# Patient Record
Sex: Female | Born: 1985
Health system: Southern US, Community
[De-identification: ages and names within clinical notes are randomized; demographics above are authoritative.]

## PROBLEM LIST (undated history)

## (undated) DIAGNOSIS — Z789 Other specified health status: Secondary | ICD-10-CM

## (undated) HISTORY — PX: NO PAST SURGERIES: SHX2092

---

## 2012-11-03 ENCOUNTER — Other Ambulatory Visit (HOSPITAL_COMMUNITY)
Admission: RE | Admit: 2012-11-03 | Discharge: 2012-11-03 | Disposition: A | Discharge status: Home / Self Care | Payer: Managed Care, Other (non HMO) | Source: Ambulatory Visit | Attending: Obstetrics and Gynecology | Admitting: Obstetrics and Gynecology

## 2012-11-03 DIAGNOSIS — N760 Acute vaginitis: Secondary | ICD-10-CM | POA: Insufficient documentation

## 2012-11-03 DIAGNOSIS — Z01419 Encounter for gynecological examination (general) (routine) without abnormal findings: Secondary | ICD-10-CM | POA: Insufficient documentation

## 2013-11-17 ENCOUNTER — Emergency Department (HOSPITAL_BASED_OUTPATIENT_CLINIC_OR_DEPARTMENT_OTHER)
Admission: EM | Admit: 2013-11-17 | Discharge: 2013-11-17 | Disposition: A | Discharge status: Home / Self Care | Payer: Managed Care, Other (non HMO) | Attending: Emergency Medicine | Admitting: Emergency Medicine

## 2013-11-17 ENCOUNTER — Encounter (HOSPITAL_BASED_OUTPATIENT_CLINIC_OR_DEPARTMENT_OTHER): Payer: Self-pay | Admitting: Emergency Medicine

## 2013-11-17 ENCOUNTER — Emergency Department (HOSPITAL_BASED_OUTPATIENT_CLINIC_OR_DEPARTMENT_OTHER): Payer: Managed Care, Other (non HMO)

## 2013-11-17 DIAGNOSIS — Z3202 Encounter for pregnancy test, result negative: Secondary | ICD-10-CM | POA: Insufficient documentation

## 2013-11-17 DIAGNOSIS — R42 Dizziness and giddiness: Secondary | ICD-10-CM | POA: Insufficient documentation

## 2013-11-17 DIAGNOSIS — N920 Excessive and frequent menstruation with regular cycle: Secondary | ICD-10-CM | POA: Insufficient documentation

## 2013-11-17 DIAGNOSIS — N921 Excessive and frequent menstruation with irregular cycle: Secondary | ICD-10-CM

## 2013-11-17 LAB — CBC WITH DIFFERENTIAL/PLATELET
Basophils Absolute: 0 10*3/uL (ref 0.0–0.1)
Basophils Relative: 0 % (ref 0–1)
Eosinophils Absolute: 0.1 10*3/uL (ref 0.0–0.7)
Eosinophils Relative: 1 % (ref 0–5)
HCT: 33.3 % — ABNORMAL LOW (ref 36.0–46.0)
HEMOGLOBIN: 11.3 g/dL — AB (ref 12.0–15.0)
Lymphocytes Relative: 38 % (ref 12–46)
Lymphs Abs: 4 10*3/uL (ref 0.7–4.0)
MCH: 27.3 pg (ref 26.0–34.0)
MCHC: 33.9 g/dL (ref 30.0–36.0)
MCV: 80.4 fL (ref 78.0–100.0)
MONOS PCT: 7 % (ref 3–12)
Monocytes Absolute: 0.8 10*3/uL (ref 0.1–1.0)
Neutro Abs: 5.6 10*3/uL (ref 1.7–7.7)
Neutrophils Relative %: 53 % (ref 43–77)
Platelets: 376 10*3/uL (ref 150–400)
RBC: 4.14 MIL/uL (ref 3.87–5.11)
RDW: 13.1 % (ref 11.5–15.5)
WBC: 10.5 10*3/uL (ref 4.0–10.5)

## 2013-11-17 LAB — PREGNANCY, URINE: Preg Test, Ur: NEGATIVE

## 2013-11-17 MED ORDER — SODIUM CHLORIDE 0.9 % IV SOLN
Freq: Once | INTRAVENOUS | Status: AC
  Administered 2013-11-17: 18:00:00 via INTRAVENOUS

## 2013-11-17 MED ORDER — IBUPROFEN 200 MG PO TABS
ORAL_TABLET | ORAL | Status: AC
  Administered 2013-11-17: 600 mg via ORAL
  Filled 2013-11-17: qty 3

## 2013-11-17 MED ORDER — IBUPROFEN 400 MG PO TABS
400.0000 mg | ORAL_TABLET | Freq: Once | ORAL | Status: AC
  Administered 2013-11-17: 600 mg via ORAL

## 2013-11-17 MED ORDER — IBUPROFEN 600 MG PO TABS: 600.0000 mg | ORAL_TABLET | Freq: Four times a day (QID) | ORAL | Status: DC | PRN

## 2013-11-17 MED ORDER — IBUPROFEN 100 MG/5ML PO SUSP
600.0000 mg | Freq: Once | ORAL | Status: DC
  Filled 2013-11-17: qty 30

## 2013-11-17 NOTE — ED Provider Notes (Signed)
Medical screening examination/treatment/procedure(s) were performed by non-physician practitioner and as supervising physician I was immediately available for consultation/collaboration.   EKG Interpretation None       Jaivion Kingsley, MD 11/17/13 1910 

## 2013-11-17 NOTE — ED Provider Notes (Signed)
CSN: [id number]     Arrival date & time 11/17/13  1603 History   First MD Initiated Contact with Patient 11/17/13 1610     Chief Complaint  Patient presents with  . Menorrhagia     (Consider location/radiation/quality/duration/timing/severity/associated sxs/prior Treatment) Patient is a 28 y.o. female presenting with vaginal bleeding. The history is provided by the patient.  Vaginal Bleeding Quality:  Bright red Severity:  Severe Onset quality:  Sudden Timing:  Constant Progression:  Worsening Chronicity:  New Menstrual history:  Irregular Number of pads used:  1 pad per hour since 11 am today Possible pregnancy: no   Relieved by:  None tried Worsened by:  Nothing tried Ineffective treatments:  None tried Associated symptoms: no abdominal pain, no back pain, no dysuria, no fever, no nausea and no vaginal discharge   Risk factors: unprotected sex   Risk factors: no bleeding disorder, no new sexual partner, no ovarian cysts and no terminated pregnancies    Dorina Ribaudo is a 28 y.o. female who presents to the ED with vaginal bleeding that started 2 days ago. She states that she had not had a period in 4 months, then on 2/27 had unprotected sex and took the morning after pill. Started bleeding 3/5 that has continued to be heavy.   History reviewed. No pertinent past medical history. History reviewed. No pertinent past surgical history. No family history on file. History  Substance Use Topics  . Smoking status: Never Smoker   . Smokeless tobacco: Not on file  . Alcohol Use: No   OB History   Grav Para Term Preterm Abortions TAB SAB Ect Mult Living   0              Review of Systems  Constitutional: Negative for fever and chills.  HENT: Negative.   Eyes: Negative for visual disturbance.  Respiratory: Negative for cough and shortness of breath.   Gastrointestinal: Negative for nausea and abdominal pain.  Genitourinary: Positive for vaginal bleeding. Negative for dysuria,  urgency, frequency and vaginal discharge.  Musculoskeletal: Negative for back pain.  Skin: Negative for rash.  Neurological: Positive for light-headedness.  Psychiatric/Behavioral: Negative for confusion. The patient is not nervous/anxious.       Allergies  Review of patient's allergies indicates no known allergies.  Home Medications  No current outpatient prescriptions on file. BP 120/79  Pulse 94  Temp(Src) 98 F (36.7 C) (Oral)  Resp 20  Ht 5\' 3"  (1.6 m)  Wt 126 lb (57.153 kg)  BMI 22.33 kg/m2  SpO2 100%  LMP 11/15/2013 Physical Exam  Nursing note and vitals reviewed. Constitutional: She is oriented to person, place, and time. She appears well-developed and well-nourished. No distress.  HENT:  Head: Normocephalic.  Eyes: EOM are normal.  Neck: Neck supple.  Cardiovascular: Normal rate and regular rhythm.   Pulmonary/Chest: Effort normal. She has no wheezes. She has no rales.  Abdominal: Soft. Bowel sounds are normal. There is no tenderness.  Genitourinary:  External genitalia without lesions, large clots vaginal vault. BB size cystic area noted in cervix. Bleeding from the cervix. Uterus without palpable enlargement, no adnexal tenderness or mass palpated.   Musculoskeletal: Normal range of motion.  Neurological: She is alert and oriented to person, place, and time. No cranial nerve deficit.  Skin: Skin is warm and dry.  Psychiatric: She has a normal mood and affect. Her behavior is normal.   Results for orders placed during the hospital encounter of 11/17/13 (from the past 24 hour(s))  PREGNANCY, URINE     Status: None   Collection Time    11/17/13  4:20 PM      Result Value Ref Range   Preg Test, Ur NEGATIVE  NEGATIVE  CBC WITH DIFFERENTIAL     Status: Abnormal   Collection Time    11/17/13  4:40 PM      Result Value Ref Range   WBC 10.5  4.0 - 10.5 K/uL   RBC 4.14  3.87 - 5.11 MIL/uL   Hemoglobin 11.3 (*) 12.0 - 15.0 g/dL   HCT 16.1 (*) 09.6 - 04.5 %   MCV  80.4  78.0 - 100.0 fL   MCH 27.3  26.0 - 34.0 pg   MCHC 33.9  30.0 - 36.0 g/dL   RDW 40.9  81.1 - 91.4 %   Platelets 376  150 - 400 K/uL   Neutrophils Relative % 53  43 - 77 %   Neutro Abs 5.6  1.7 - 7.7 K/uL   Lymphocytes Relative 38  12 - 46 %   Lymphs Abs 4.0  0.7 - 4.0 K/uL   Monocytes Relative 7  3 - 12 %   Monocytes Absolute 0.8  0.1 - 1.0 K/uL   Eosinophils Relative 1  0 - 5 %   Eosinophils Absolute 0.1  0.0 - 0.7 K/uL   Basophils Relative 0  0 - 1 %   Basophils Absolute 0.0  0.0 - 0.1 K/uL   US Transvaginal Non-ob  11/17/2013   CLINICAL DATA:  Heavy vaginal bleeding since earlier today  EXAM: TRANSABDOMINAL AND TRANSVAGINAL ULTRASOUND OF PELVIS  TECHNIQUE: Both transabdominal and transvaginal ultrasound examinations of the pelvis were performed. Transabdominal technique was performed for global imaging of the pelvis including uterus, ovaries, adnexal regions, and pelvic cul-de-sac. It was necessary to proceed with endovaginal exam following the transabdominal exam to visualize the uterus and ovaries and adnexal structures.  COMPARISON:  None  FINDINGS: Uterus  Measurements: 7.7 x 3.4 x 4.4 cm. No fibroids or other mass visualized. The uterus is retroverted  Endometrium  Thickness: 9.4 mm. The echotexture of the endometrium is heterogeneous. No endometrial polyp is demonstrated.  .  Right ovary  Measurements: 3.3 x 2.0 x 2.4 cm. Normal appearance/no adnexal mass.  Left ovary  Measurements: 2.7 x 2.3 x 2.6 cm. Normal appearance/no adnexal mass.  Other findings  No free fluid.  IMPRESSION: 1. The endometrial stripe appears mildly thickened and is heterogeneous in echotexture. No polyp is demonstrated. 2. The uterus is retroverted.  No fibroids are demonstrated. 3. The ovaries appear normal. 4. There is no free fluid within the pelvis.   Electronically Signed   By: David  [place]   On: 11/17/2013 18:44   US Pelvis Complete  11/17/2013   CLINICAL DATA:  Heavy vaginal bleeding since earlier today   EXAM: TRANSABDOMINAL AND TRANSVAGINAL ULTRASOUND OF PELVIS  TECHNIQUE: Both transabdominal and transvaginal ultrasound examinations of the pelvis were performed. Transabdominal technique was performed for global imaging of the pelvis including uterus, ovaries, adnexal regions, and pelvic cul-de-sac. It was necessary to proceed with endovaginal exam following the transabdominal exam to visualize the uterus and ovaries and adnexal structures.  COMPARISON:  None  FINDINGS: Uterus  Measurements: 7.7 x 3.4 x 4.4 cm. No fibroids or other mass visualized. The uterus is retroverted  Endometrium  Thickness: 9.4 mm. The echotexture of the endometrium is heterogeneous. No endometrial polyp is demonstrated.  .  Right ovary  Measurements: 3.3 x 2.0 x 2.4  cm. Normal appearance/no adnexal mass.  Left ovary  Measurements: 2.7 x 2.3 x 2.6 cm. Normal appearance/no adnexal mass.  Other findings  No free fluid.  IMPRESSION: 1. The endometrial stripe appears mildly thickened and is heterogeneous in echotexture. No polyp is demonstrated. 2. The uterus is retroverted.  No fibroids are demonstrated. 3. The ovaries appear normal. 4. There is no free fluid within the pelvis.   Electronically Signed   By: David  [place]   On: 11/17/2013 18:44     ED Course:   Procedures After Normal saline 1000 ccs and PO ibuprofen patient feeling much better and bleeding is light.  MDM  28 y.o. female with heavy vaginal bleeding after no period x 4 months. Hx of irregular periods. She is stable for discharge without any signs of hemorrhage. Vital signs stable, she is not orthostatic, Hgb is stable and pregnancy test is negative. She will follow up with her GYN Dr. Dion BodyVarnado. If her symptoms worsen [facility].  Discussed with the patient and all questioned fully answered.   Medication List         ibuprofen 600 MG tablet  Commonly known as:  ADVIL,MOTRIN  Take 1 tablet (600 mg total) by mouth every 6  (six) hours as needed.         [facility] Orlene OchM Neese, [place]NP 11/17/13 1905

## 2013-11-17 NOTE — Discharge Instructions (Signed)

## 2013-11-17 NOTE — ED Notes (Signed)
Patient transported to Ultrasound 

## 2013-11-17 NOTE — ED Notes (Signed)
Reports absence of her period x four months.  Took a pregnancy test which resulted negative 11/05/13.  Had unprotected sex 11/08/13.  Took the Emergency Contraceptive Pill 11/09/13.  Onset of heavy bleeding on 11/15/13.

## 2014-10-13 IMAGING — US US PELVIS COMPLETE
1 series · 13 of 25 positions shown · non-contrast
Comparison: None

CLINICAL DATA: Heavy vaginal bleeding since earlier today

EXAM:
TRANSABDOMINAL AND TRANSVAGINAL ULTRASOUND OF PELVIS
TECHNIQUE: Both transabdominal and transvaginal ultrasound examinations of the
pelvis were performed. Transabdominal technique was performed for
global imaging of the pelvis including uterus, ovaries, adnexal
regions, and pelvic cul-de-sac. It was necessary to proceed with
endovaginal exam following the transabdominal exam to visualize the
uterus and ovaries and adnexal structures.

[Series 1: us pelvis complete · 0.33mm/px · 13 of 101 slices shown]
[im 1/101]
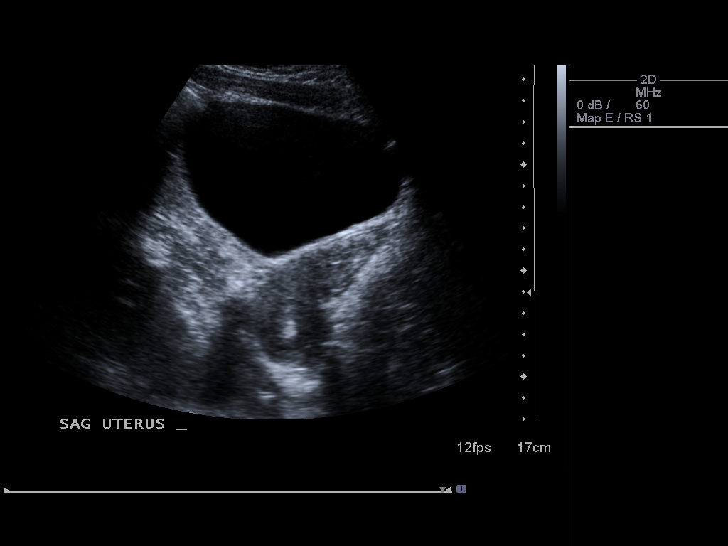
[im 9/101]
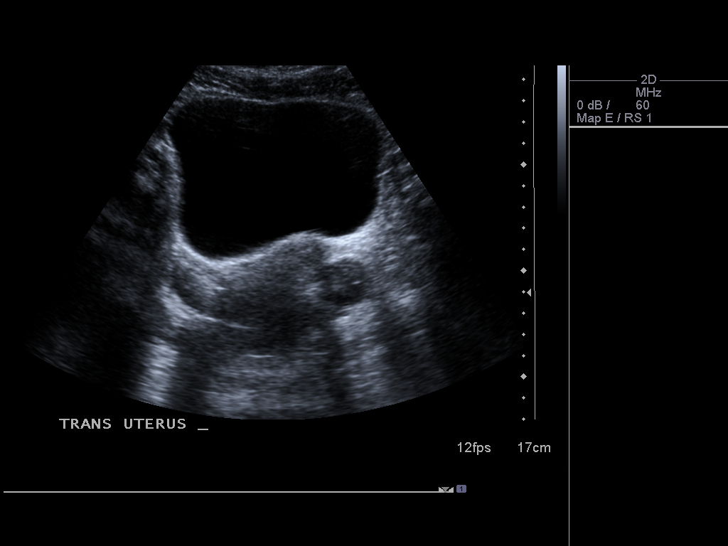
[im 17/101]
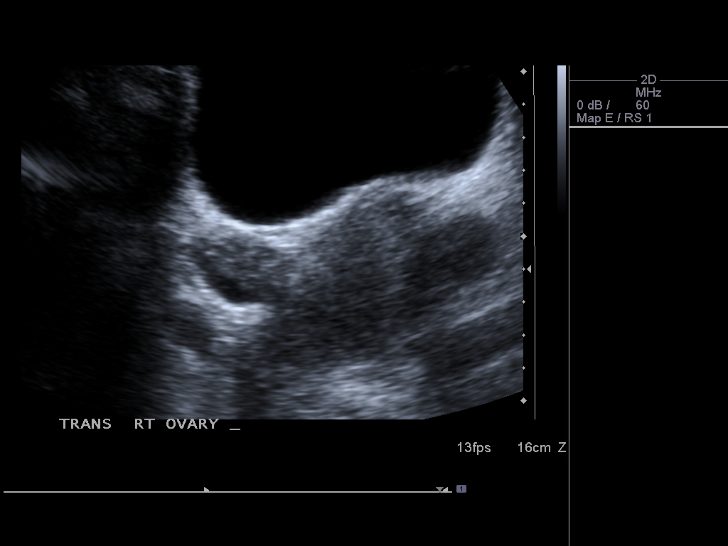
[im 26/101]
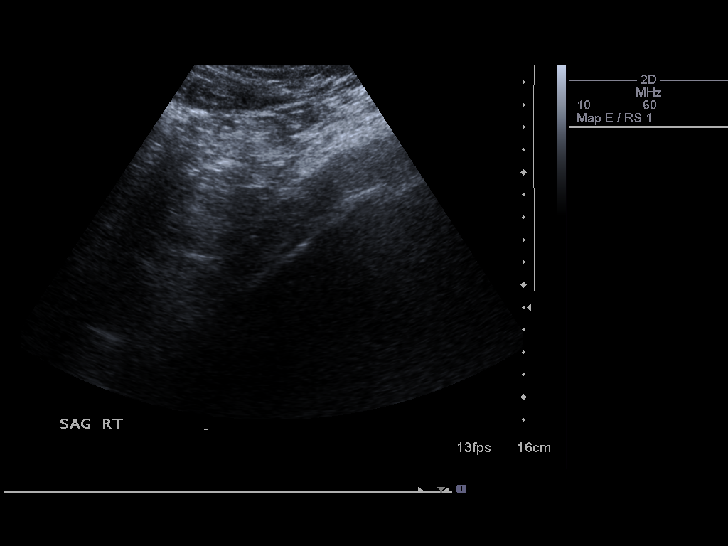
[im 34/101]
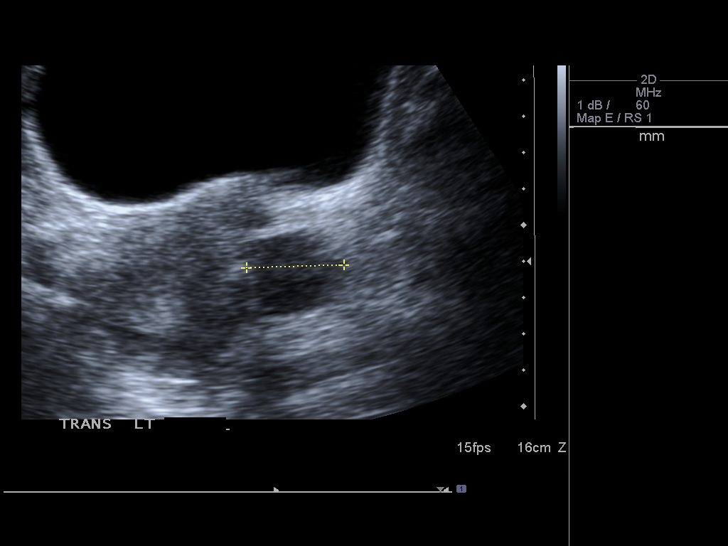
[im 42/101]
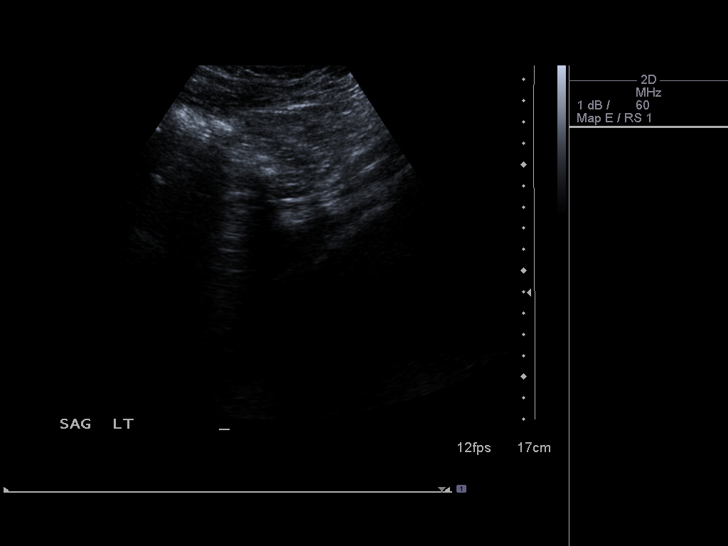
[im 51/101]
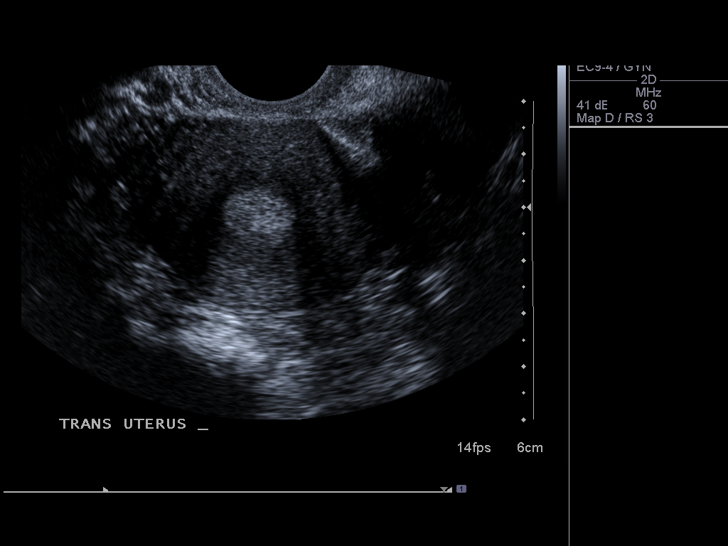
[im 59/101]
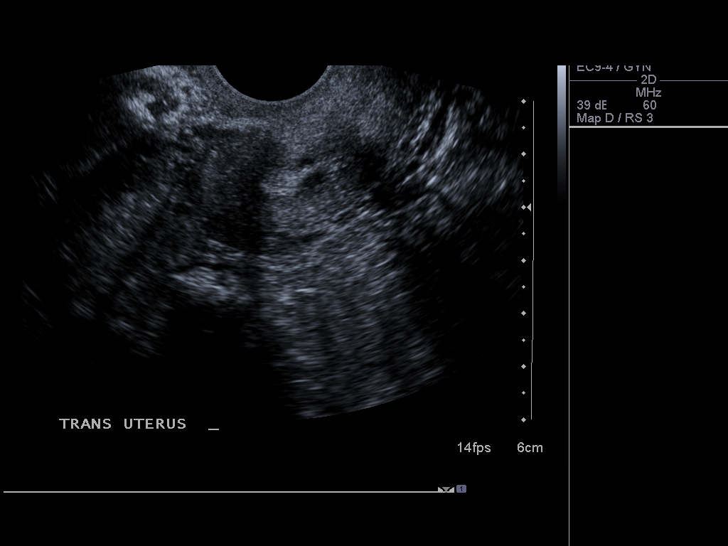
[im 67/101]
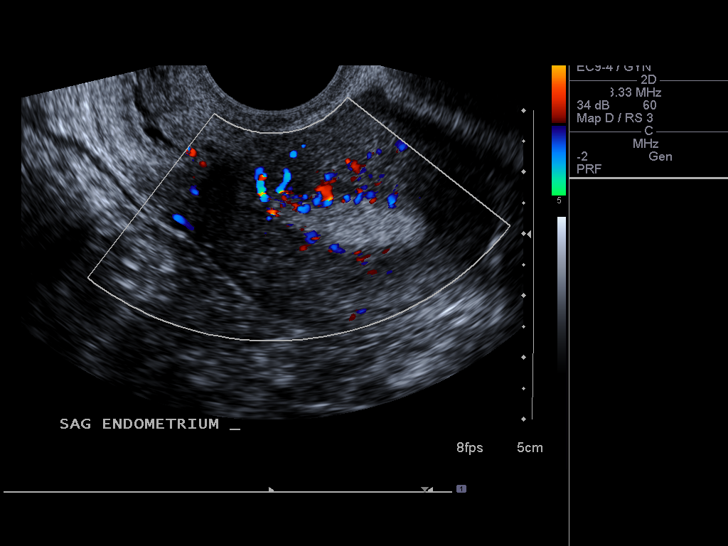
[im 76/101]
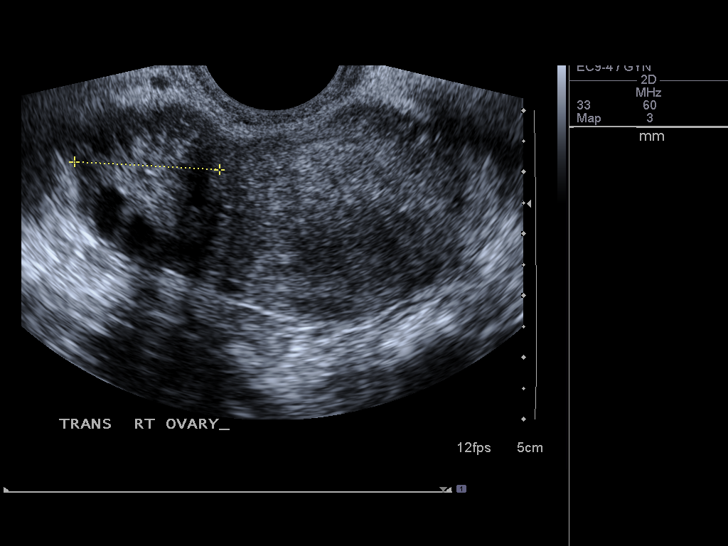
[im 84/101]
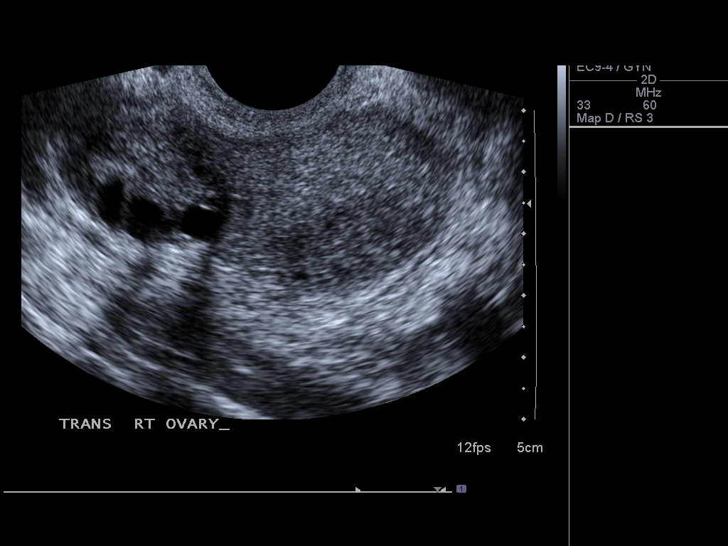
[im 92/101]
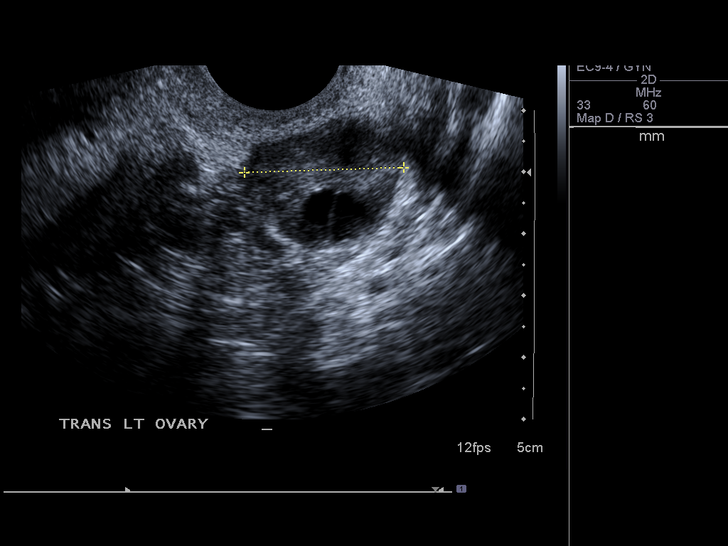
[im 101/101]
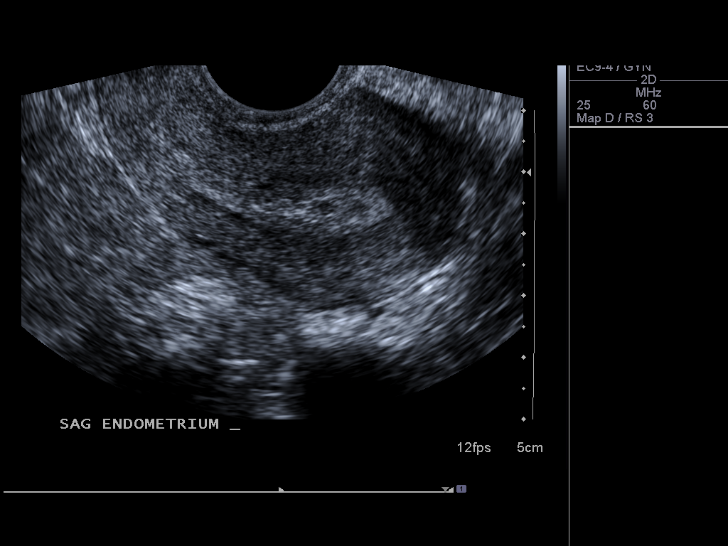

[13 of 25 positions shown; findings below may reference images not displayed]

FINDINGS: Uterus

Measurements: 7.7 x 3.4 x 4.4 cm.. No fibroids or other mass
visualized. The uterus is retroverted

Endometrium

Thickness: 9.4 mm. The echotexture of the endometrium is
heterogeneous. No endometrial polyp is demonstrated.

.

Right ovary

Measurements: 3.3 x 2.0 x 2.4 cm. Normal appearance/no adnexal mass.

Left ovary

Measurements: 2.7 x 2.3 x 2.6 cm. Normal appearance/no adnexal mass.

Other findings

No free fluid.
IMPRESSION: 1. The endometrial stripe appears mildly thickened and is
heterogeneous in echotexture. No polyp is demonstrated.
2. The uterus is retroverted.  No fibroids are demonstrated.
3. The ovaries appear normal.
4. There is no free fluid within the pelvis.

## 2017-08-08 LAB — OB RESULTS CONSOLE RPR: RPR: NONREACTIVE

## 2017-08-08 LAB — OB RESULTS CONSOLE GC/CHLAMYDIA
Chlamydia: NEGATIVE
Gonorrhea: NEGATIVE

## 2017-08-08 LAB — OB RESULTS CONSOLE HIV ANTIBODY (ROUTINE TESTING): HIV: NONREACTIVE

## 2017-08-08 LAB — OB RESULTS CONSOLE HEPATITIS B SURFACE ANTIGEN: HEP B S AG: NEGATIVE

## 2017-08-08 LAB — OB RESULTS CONSOLE RUBELLA ANTIBODY, IGM: RUBELLA: IMMUNE

## 2017-09-07 ENCOUNTER — Other Ambulatory Visit (HOSPITAL_COMMUNITY)
Admission: RE | Admit: 2017-09-07 | Discharge: 2017-09-07 | Disposition: A | Discharge status: Home / Self Care | Payer: 59 | Source: Ambulatory Visit | Attending: Obstetrics and Gynecology | Admitting: Obstetrics and Gynecology

## 2017-09-07 DIAGNOSIS — Z124 Encounter for screening for malignant neoplasm of cervix: Secondary | ICD-10-CM | POA: Diagnosis not present

## 2017-09-08 ENCOUNTER — Other Ambulatory Visit: Payer: Self-pay | Admitting: Obstetrics and Gynecology

## 2017-09-14 LAB — CYTOLOGY - PAP
CHLAMYDIA, DNA PROBE: NEGATIVE
Diagnosis: NEGATIVE
HPV (WINDOPATH): NOT DETECTED
Neisseria Gonorrhea: NEGATIVE

## 2018-03-20 ENCOUNTER — Inpatient Hospital Stay (HOSPITAL_COMMUNITY): Payer: 59 | Admitting: Anesthesiology

## 2018-03-20 ENCOUNTER — Inpatient Hospital Stay (HOSPITAL_COMMUNITY)
Admission: AD | Admit: 2018-03-20 | Discharge: 2018-03-23 | DRG: 788 | Disposition: A | Discharge status: Home / Self Care | Payer: 59 | Attending: Obstetrics and Gynecology | Admitting: Obstetrics and Gynecology

## 2018-03-20 ENCOUNTER — Encounter (HOSPITAL_COMMUNITY): Payer: Self-pay | Admitting: *Deleted

## 2018-03-20 ENCOUNTER — Encounter (HOSPITAL_COMMUNITY)
Admission: AD | Disposition: A | Discharge status: Home / Self Care | Payer: Self-pay | Source: Home / Self Care | Attending: Obstetrics and Gynecology

## 2018-03-20 DIAGNOSIS — O322XX0 Maternal care for transverse and oblique lie, not applicable or unspecified: Principal | ICD-10-CM | POA: Diagnosis present

## 2018-03-20 DIAGNOSIS — O99824 Streptococcus B carrier state complicating childbirth: Secondary | ICD-10-CM | POA: Diagnosis present

## 2018-03-20 DIAGNOSIS — Z3A40 40 weeks gestation of pregnancy: Secondary | ICD-10-CM

## 2018-03-20 DIAGNOSIS — D649 Anemia, unspecified: Secondary | ICD-10-CM | POA: Diagnosis present

## 2018-03-20 DIAGNOSIS — O9902 Anemia complicating childbirth: Secondary | ICD-10-CM | POA: Diagnosis present

## 2018-03-20 DIAGNOSIS — Z3483 Encounter for supervision of other normal pregnancy, third trimester: Secondary | ICD-10-CM | POA: Diagnosis present

## 2018-03-20 DIAGNOSIS — O99284 Endocrine, nutritional and metabolic diseases complicating childbirth: Secondary | ICD-10-CM | POA: Diagnosis present

## 2018-03-20 DIAGNOSIS — E039 Hypothyroidism, unspecified: Secondary | ICD-10-CM | POA: Diagnosis present

## 2018-03-20 HISTORY — DX: Other specified health status: Z78.9

## 2018-03-20 LAB — RPR: RPR Ser Ql: NONREACTIVE

## 2018-03-20 LAB — CBC
HCT: 35.2 % — ABNORMAL LOW (ref 36.0–46.0)
HEMOGLOBIN: 11.9 g/dL — AB (ref 12.0–15.0)
MCH: 27.8 pg (ref 26.0–34.0)
MCHC: 33.8 g/dL (ref 30.0–36.0)
MCV: 82.2 fL (ref 78.0–100.0)
Platelets: 268 10*3/uL (ref 150–400)
RBC: 4.28 MIL/uL (ref 3.87–5.11)
RDW: 14.5 % (ref 11.5–15.5)
WBC: 8.4 10*3/uL (ref 4.0–10.5)

## 2018-03-20 LAB — TYPE AND SCREEN
ABO/RH(D): AB POS
ANTIBODY SCREEN: NEGATIVE

## 2018-03-20 LAB — OB RESULTS CONSOLE GBS: STREP GROUP B AG: POSITIVE

## 2018-03-20 LAB — ABO/RH: ABO/RH(D): AB POS

## 2018-03-20 SURGERY — Surgical Case
Anesthesia: Epidural

## 2018-03-20 MED ORDER — SODIUM CHLORIDE 0.9 % IV SOLN
5.0000 10*6.[IU] | Freq: Once | INTRAVENOUS | Status: AC
  Administered 2018-03-20: 5 10*6.[IU] via INTRAVENOUS
  Filled 2018-03-20: qty 5

## 2018-03-20 MED ORDER — DIPHENHYDRAMINE HCL 50 MG/ML IJ SOLN: 12.5000 mg | INTRAMUSCULAR | Status: DC | PRN

## 2018-03-20 MED ORDER — FENTANYL 2.5 MCG/ML BUPIVACAINE 1/10 % EPIDURAL INFUSION (WH - ANES)
14.0000 mL/h | INTRAMUSCULAR | Status: DC | PRN
  Administered 2018-03-20 (×2): 14 mL/h via EPIDURAL
  Filled 2018-03-20 (×2): qty 100

## 2018-03-20 MED ORDER — LACTATED RINGERS IV SOLN
INTRAVENOUS | Status: DC
  Administered 2018-03-20 (×5): via INTRAVENOUS

## 2018-03-20 MED ORDER — OXYTOCIN 40 UNITS IN LACTATED RINGERS INFUSION - SIMPLE MED
1.0000 m[IU]/min | INTRAVENOUS | Status: DC
  Administered 2018-03-20: 2 m[IU]/min via INTRAVENOUS
  Filled 2018-03-20: qty 1000

## 2018-03-20 MED ORDER — PHENYLEPHRINE 40 MCG/ML (10ML) SYRINGE FOR IV PUSH (FOR BLOOD PRESSURE SUPPORT): 80.0000 ug | PREFILLED_SYRINGE | INTRAVENOUS | Status: DC | PRN

## 2018-03-20 MED ORDER — SCOPOLAMINE 1 MG/3DAYS TD PT72
MEDICATED_PATCH | TRANSDERMAL | Status: AC
  Filled 2018-03-20: qty 1

## 2018-03-20 MED ORDER — LACTATED RINGERS IV SOLN: 500.0000 mL | INTRAVENOUS | Status: DC | PRN

## 2018-03-20 MED ORDER — LIDOCAINE HCL (PF) 1 % IJ SOLN
30.0000 mL | INTRAMUSCULAR | Status: DC | PRN
  Filled 2018-03-20: qty 30

## 2018-03-20 MED ORDER — CEFAZOLIN SODIUM-DEXTROSE 2-3 GM-%(50ML) IV SOLR
INTRAVENOUS | Status: DC | PRN
  Administered 2018-03-20: 2 g via INTRAVENOUS

## 2018-03-20 MED ORDER — ONDANSETRON HCL 4 MG/2ML IJ SOLN
INTRAMUSCULAR | Status: DC | PRN
  Administered 2018-03-20: 4 mg via INTRAVENOUS

## 2018-03-20 MED ORDER — DEXAMETHASONE SODIUM PHOSPHATE 10 MG/ML IJ SOLN
INTRAMUSCULAR | Status: DC | PRN
  Administered 2018-03-20: 10 mg via INTRAVENOUS

## 2018-03-20 MED ORDER — SOD CITRATE-CITRIC ACID 500-334 MG/5ML PO SOLN
30.0000 mL | ORAL | Status: DC | PRN
  Administered 2018-03-20: 30 mL via ORAL
  Filled 2018-03-20: qty 15

## 2018-03-20 MED ORDER — PHENYLEPHRINE 40 MCG/ML (10ML) SYRINGE FOR IV PUSH (FOR BLOOD PRESSURE SUPPORT)
80.0000 ug | PREFILLED_SYRINGE | INTRAVENOUS | Status: DC | PRN
  Filled 2018-03-20 (×2): qty 10

## 2018-03-20 MED ORDER — OXYTOCIN 40 UNITS IN LACTATED RINGERS INFUSION - SIMPLE MED: 2.5000 [IU]/h | INTRAVENOUS | Status: DC

## 2018-03-20 MED ORDER — LACTATED RINGERS IV SOLN: 500.0000 mL | Freq: Once | INTRAVENOUS | Status: DC

## 2018-03-20 MED ORDER — MEPERIDINE HCL 25 MG/ML IJ SOLN
INTRAMUSCULAR | Status: DC | PRN
  Administered 2018-03-20 (×2): 12.5 mg via INTRAVENOUS

## 2018-03-20 MED ORDER — SODIUM CHLORIDE 0.9 % IR SOLN
Status: DC | PRN
  Administered 2018-03-20 (×2): 1

## 2018-03-20 MED ORDER — PENICILLIN G POT IN DEXTROSE 60000 UNIT/ML IV SOLN
3.0000 10*6.[IU] | INTRAVENOUS | Status: DC
  Administered 2018-03-20 (×4): 3 10*6.[IU] via INTRAVENOUS
  Filled 2018-03-20 (×5): qty 50

## 2018-03-20 MED ORDER — TERBUTALINE SULFATE 1 MG/ML IJ SOLN
0.2500 mg | Freq: Once | INTRAMUSCULAR | Status: DC | PRN
Start: 2018-03-20 — End: 2018-03-21

## 2018-03-20 MED ORDER — SCOPOLAMINE 1 MG/3DAYS TD PT72
MEDICATED_PATCH | TRANSDERMAL | Status: DC | PRN
  Administered 2018-03-20: 1 via TRANSDERMAL

## 2018-03-20 MED ORDER — OXYCODONE-ACETAMINOPHEN 5-325 MG PO TABS: 2.0000 | ORAL_TABLET | ORAL | Status: DC | PRN

## 2018-03-20 MED ORDER — FENTANYL CITRATE (PF) 100 MCG/2ML IJ SOLN: 50.0000 ug | INTRAMUSCULAR | Status: DC | PRN

## 2018-03-20 MED ORDER — LIDOCAINE HCL (PF) 1 % IJ SOLN
INTRAMUSCULAR | Status: DC | PRN
  Administered 2018-03-20 (×2): 5 mL via EPIDURAL

## 2018-03-20 MED ORDER — OXYCODONE-ACETAMINOPHEN 5-325 MG PO TABS: 1.0000 | ORAL_TABLET | ORAL | Status: DC | PRN

## 2018-03-20 MED ORDER — ONDANSETRON HCL 4 MG/2ML IJ SOLN: 4.0000 mg | Freq: Four times a day (QID) | INTRAMUSCULAR | Status: DC | PRN

## 2018-03-20 MED ORDER — ACETAMINOPHEN 325 MG PO TABS
650.0000 mg | ORAL_TABLET | ORAL | Status: DC | PRN
  Administered 2018-03-20: 650 mg via ORAL
  Filled 2018-03-20: qty 2

## 2018-03-20 MED ORDER — DEXAMETHASONE SODIUM PHOSPHATE 10 MG/ML IJ SOLN
INTRAMUSCULAR | Status: AC
  Filled 2018-03-20: qty 1

## 2018-03-20 MED ORDER — OXYTOCIN BOLUS FROM INFUSION: 500.0000 mL | Freq: Once | INTRAVENOUS | Status: DC

## 2018-03-20 MED ORDER — OXYTOCIN 10 UNIT/ML IJ SOLN
INTRAMUSCULAR | Status: DC | PRN
  Administered 2018-03-20: 40 [IU] via INTRAVENOUS

## 2018-03-20 MED ORDER — ONDANSETRON HCL 4 MG/2ML IJ SOLN
INTRAMUSCULAR | Status: AC
  Filled 2018-03-20: qty 2

## 2018-03-20 MED ORDER — EPHEDRINE 5 MG/ML INJ
10.0000 mg | INTRAVENOUS | Status: DC | PRN
Start: 2018-03-20 — End: 2018-03-21

## 2018-03-20 MED ORDER — LACTATED RINGERS IV SOLN
INTRAVENOUS | Status: DC | PRN
  Administered 2018-03-20: via INTRAVENOUS

## 2018-03-20 MED ORDER — MORPHINE SULFATE (PF) 0.5 MG/ML IJ SOLN
INTRAMUSCULAR | Status: AC
  Filled 2018-03-20: qty 10

## 2018-03-20 MED ORDER — EPHEDRINE 5 MG/ML INJ: 10.0000 mg | INTRAVENOUS | Status: DC | PRN

## 2018-03-20 MED ORDER — MEPERIDINE HCL 25 MG/ML IJ SOLN
INTRAMUSCULAR | Status: AC
  Filled 2018-03-20: qty 1

## 2018-03-20 MED ORDER — LIDOCAINE-EPINEPHRINE (PF) 2 %-1:200000 IJ SOLN
INTRAMUSCULAR | Status: DC | PRN
  Administered 2018-03-20: 10 mL via EPIDURAL

## 2018-03-20 MED ORDER — CEFAZOLIN SODIUM-DEXTROSE 2-4 GM/100ML-% IV SOLN
INTRAVENOUS | Status: AC
  Filled 2018-03-20: qty 100

## 2018-03-20 MED ORDER — MORPHINE SULFATE (PF) 0.5 MG/ML IJ SOLN
INTRAMUSCULAR | Status: DC | PRN
  Administered 2018-03-20: 4 mg via EPIDURAL

## 2018-03-20 MED ORDER — OXYTOCIN 10 UNIT/ML IJ SOLN
INTRAMUSCULAR | Status: AC
  Filled 2018-03-20: qty 4

## 2018-03-20 SURGICAL SUPPLY — 39 items
BARRIER ADHS 3X4 INTERCEED (GAUZE/BANDAGES/DRESSINGS) ×4 IMPLANT
BENZOIN TINCTURE PRP APPL 2/3 (GAUZE/BANDAGES/DRESSINGS) ×2 IMPLANT
CHLORAPREP W/TINT 26ML (MISCELLANEOUS) ×2 IMPLANT
CLAMP CORD UMBIL (MISCELLANEOUS) IMPLANT
CLOTH BEACON ORANGE TIMEOUT ST (SAFETY) ×2 IMPLANT
DERMABOND ADVANCED (GAUZE/BANDAGES/DRESSINGS)
DERMABOND ADVANCED .7 DNX12 (GAUZE/BANDAGES/DRESSINGS) IMPLANT
DRSG OPSITE POSTOP 4X10 (GAUZE/BANDAGES/DRESSINGS) ×2 IMPLANT
ELECT REM PT RETURN 9FT ADLT (ELECTROSURGICAL) ×2
ELECTRODE REM PT RTRN 9FT ADLT (ELECTROSURGICAL) ×1 IMPLANT
EXTRACTOR VACUUM BELL STYLE (SUCTIONS) ×2 IMPLANT
GLOVE BIO SURGEON STRL SZ7 (GLOVE) ×2 IMPLANT
GLOVE BIOGEL PI IND STRL 7.0 (GLOVE) ×2 IMPLANT
GLOVE BIOGEL PI INDICATOR 7.0 (GLOVE) ×2
GOWN STRL REUS W/TWL LRG LVL3 (GOWN DISPOSABLE) ×4 IMPLANT
KIT ABG SYR 3ML LUER SLIP (SYRINGE) IMPLANT
NEEDLE HYPO 25X5/8 SAFETYGLIDE (NEEDLE) ×2 IMPLANT
NS IRRIG 1000ML POUR BTL (IV SOLUTION) ×2 IMPLANT
PACK C SECTION WH (CUSTOM PROCEDURE TRAY) ×2 IMPLANT
PAD ABD 7.5X8 STRL (GAUZE/BANDAGES/DRESSINGS) ×2 IMPLANT
PAD OB MATERNITY 4.3X12.25 (PERSONAL CARE ITEMS) ×2 IMPLANT
PENCIL SMOKE EVAC W/HOLSTER (ELECTROSURGICAL) ×2 IMPLANT
RETRACTOR TRAXI PANNICULUS (MISCELLANEOUS) ×1 IMPLANT
RTRCTR C-SECT PINK 25CM LRG (MISCELLANEOUS) ×2 IMPLANT
SPONGE GAUZE 4X4 12PLY STER LF (GAUZE/BANDAGES/DRESSINGS) ×4 IMPLANT
SPONGE LAP 18X18 RF (DISPOSABLE) ×4 IMPLANT
STRIP CLOSURE SKIN 1/2X4 (GAUZE/BANDAGES/DRESSINGS) ×2 IMPLANT
SUT CHROMIC 0 CTX 36 (SUTURE) IMPLANT
SUT MON AB 4-0 PS1 27 (SUTURE) ×4 IMPLANT
SUT PLAIN 0 NONE (SUTURE) IMPLANT
SUT PLAIN 2 0 XLH (SUTURE) ×4 IMPLANT
SUT VIC AB 0 CTX 36 (SUTURE) ×5
SUT VIC AB 0 CTX36XBRD ANBCTRL (SUTURE) ×5 IMPLANT
SUT VIC AB 2-0 CT1 27 (SUTURE) ×1
SUT VIC AB 2-0 CT1 TAPERPNT 27 (SUTURE) ×1 IMPLANT
TOWEL OR 17X24 6PK STRL BLUE (TOWEL DISPOSABLE) ×2 IMPLANT
TRAXI PANNICULUS RETRACTOR (MISCELLANEOUS) ×1
TRAY FOLEY W/BAG SLVR 14FR LF (SET/KITS/TRAYS/PACK) IMPLANT
WATER STERILE IRR 1000ML POUR (IV SOLUTION) ×2 IMPLANT

## 2018-03-20 NOTE — Progress Notes (Signed)
Pt comfortable with epidural.  BP 111/70   Pulse 75   Temp 98 F (36.7 C) (Oral)   Resp 18   Ht 5\' 3"  (1.6 m)   Wt 68.5 kg (151 lb)   LMP 06/03/2017   SpO2 100%   BMI 26.75 kg/m   Gen: NAD Cervix 4-5/80/0, posterior, soft.  Cat I Contractions 2-5 minutes  A/P IUP @ 40 3/7 weeks Spontaneous labor Contractions spaced.  Start Pitocin. Anticipate SVD.

## 2018-03-20 NOTE — Anesthesia Procedure Notes (Signed)
Epidural Patient location during procedure: OB Start time: 03/20/2018 5:26 AM End time: 03/20/2018 5:36 AM  Staffing Anesthesiologist: Achille RichHodierne, Sennie Borden, MD Performed: anesthesiologist   Preanesthetic Checklist Completed: patient identified, site marked, pre-op evaluation, timeout performed, IV checked, risks and benefits discussed and monitors and equipment checked  Epidural Patient position: sitting Prep: DuraPrep Patient monitoring: heart rate, cardiac monitor, continuous pulse ox and blood pressure Approach: midline Location: L2-L3 Injection technique: LOR saline  Needle:  Needle type: Tuohy  Needle gauge: 17 G Needle length: 9 cm Needle insertion depth: 4 cm Catheter type: closed end flexible Catheter size: 19 Gauge Catheter at skin depth: 10 cm Test dose: negative and Other  Assessment Events: blood not aspirated, injection not painful, no injection resistance and negative IV test  Additional Notes Informed consent obtained prior to proceeding including risk of failure, 1% risk of PDPH, risk of minor discomfort and bruising.  Discussed rare but serious complications including epidural abscess, permanent nerve injury, epidural hematoma.  Discussed alternatives to epidural analgesia and patient desires to proceed.  Timeout performed pre-procedure verifying patient name, procedure, and platelet count.  Patient tolerated procedure well. Reason for block:procedure for pain

## 2018-03-20 NOTE — Anesthesia Preprocedure Evaluation (Signed)
Anesthesia Evaluation  Patient identified by MRN, date of birth, ID band Patient awake    Reviewed: Allergy & Precautions, H&P , NPO status , Patient's Chart, lab work & pertinent test results  Airway Mallampati: II   Neck ROM: full    Dental   Pulmonary neg pulmonary ROS,    breath sounds clear to auscultation       Cardiovascular negative cardio ROS   Rhythm:regular Rate:Normal     Neuro/Psych    GI/Hepatic   Endo/Other    Renal/GU      Musculoskeletal   Abdominal   Peds  Hematology   Anesthesia Other Findings   Reproductive/Obstetrics (+) Pregnancy                             Anesthesia Physical Anesthesia Plan  ASA: I  Anesthesia Plan: Epidural   Post-op Pain Management:    Induction: Intravenous  PONV Risk Score and Plan: 2 and Treatment may vary due to age or medical condition  Airway Management Planned: Natural Airway  Additional Equipment:   Intra-op Plan:   Post-operative Plan:   Informed Consent: I have reviewed the patients History and Physical, chart, labs and discussed the procedure including the risks, benefits and alternatives for the proposed anesthesia with the patient or authorized representative who has indicated his/her understanding and acceptance.       Plan Discussed with: Anesthesiologist  Anesthesia Plan Comments:         Anesthesia Quick Evaluation  

## 2018-03-20 NOTE — MAU Note (Signed)
Urine in lab 

## 2018-03-20 NOTE — Progress Notes (Signed)
Jarvis NewcomerShivangi Allena Katzatel is a 32 y.o. G1P0 at 4746w2d  Subjective: Pt feeling pressure.  Vomiting x 2, +shakes.  Objective: BP 115/78   Pulse (!) 102   Temp 98.1 F (36.7 C) (Oral)   Resp 18   Ht 5\' 3"  (1.6 m)   Wt 68.5 kg (151 lb)   LMP 06/03/2017   SpO2 100%   BMI 26.75 kg/m  No intake/output data recorded. Total I/O In: -  Out: 650 [Urine:650]  FHT:  FHR: 130s bpm, variability: moderate,  accelerations:  Present,  decelerations:  Present previous variable late and early have resolved UC:   regular, every 2 minutes SVE:   Dilation: 9(Thick in front and on side) Effacement (%): 90 Station: 0 Exam by:: LIma, rn AROM clear fluid, minimal  Labs: Lab Results  Component Value Date   WBC 8.4 03/20/2018   HGB 11.9 (L) 03/20/2018   HCT 35.2 (L) 03/20/2018   MCV 82.2 03/20/2018   PLT 268 03/20/2018    Assessment / Plan: IUP @ 34 2/7 weeks  Labor: Progressing normally and Continue Pitocin at 4 mUs. Preeclampsia:  BP normal Fetal Wellbeing:  Category I and currently Pain Control:  Epidural I/D:  S/p PCN x 2 doses Anticipated MOD:  NSVD  Geryl Rankinsvelyn Sydell Prowell 03/20/2018, 2:01 PM

## 2018-03-20 NOTE — Progress Notes (Addendum)
Pt comfortable.  BP 108/73   Pulse (!) 102   Temp 98 F (36.7 C) (Oral)   Resp 18   Ht 5\' 3"  (1.6 m)   Wt 68.5 kg (151 lb)   LMP 06/03/2017   SpO2 100%   BMI 26.75 kg/m   Gen:  NAD Cervix:  Anterior lip b/w 9 and 1 o'clock, not reducible/ +1-+2 station, some caput noted.  EM:  Reassuring, Contractions q 1-3 min  Pitocin 1 mUs, MVU 200-250  Pt turned to right side on peanut.    Reassess in 45 minutes.  Start pushing when completely dilated.

## 2018-03-20 NOTE — Anesthesia Pain Management Evaluation Note (Signed)
  CRNA Pain Management Visit Note  Patient: Colleen BanterShivangi Siebers, 32 y.o., female  "Hello I am a member of the anesthesia team at [facility]. We have an anesthesia team available at all times to provide care throughout the hospital, including epidural management and anesthesia for C-section. I don't know your plan for the delivery whether it a natural birth, water birth, IV sedation, nitrous supplementation, doula or epidural, but we want to meet your pain goals."   1.Was your pain managed to your expectations on prior hospitalizations?   No prior hospitalizations  2.What is your expectation for pain management during this hospitalization?     Epidural  3.How can we help you reach that goal? Maintain epidural until delivery of infant.  Record the patient's initial score and the patient's pain goal.   Pain: 0  Pain Goal: 0 [facility] wants you to be able to say your pain was always managed very well.  Merryl Buckels 03/20/2018

## 2018-03-20 NOTE — H&P (Signed)
Colleen NewcomerShivangi Allena Sandoval is a 32 y.o. female, G1P0 at 40.2 weeks, presenting for contractions and spotting. Pt received her prenatal care at [facility] Pt states contractions started at 2300.  Having some bloody show.  FM+  Prenatal hx is remarkable for treatment of hyperthyroidism.  On Levothyroxine 75 mcg.  Pt had a polyp on her cervix.      History of present pregnancy: Patient entered care at 11.5 weeks.   EDC of 03/18/2018 was established by LMP.   Anatomy scan: 20 weeks, with normal findings.    Significant prenatal events:  None  Last evaluation:  Last week  OB History    Gravida  1   Para      Term      Preterm      AB      Living        SAB      TAB      Ectopic      Multiple      Live Births             History reviewed. No pertinent past medical history. History reviewed. No pertinent surgical history. Family History: family history is not on file. Social History:  reports that she has never smoked. She has never used smokeless tobacco. She reports that she does not drink alcohol or use drugs.   Prenatal Transfer Tool  Maternal Diabetes: No Genetic Screening: Normal Maternal Ultrasounds/Referrals: Normal Fetal Ultrasounds or other Referrals:  None Maternal Substance Abuse:  No Significant Maternal Medications:  None Significant Maternal Lab Results: None   ROS:  All 10 systems reviewed and negative except as stated above  No Known Allergies     Blood pressure 120/88, pulse 66, temperature 97.6 F (36.4 C), temperature source Oral, resp. rate 16, height 5\' 3"  (1.6 m), weight 68.5 kg (151 lb).  Chest clear Heart RRR without murmur Abd gravid, NT, FH appropriate Pelvic: 2/90 Vtx Ext: +2/+2 normal reflexes  FHR: Category 1  FHT 125 + variability accelss UCs:  Every 3 to 5  Prenatal labs: ABO, Rh:  AB positive Antibody:  Neg Rubella:   Imm RPR:   NR HBsAg:   Neg HIV:   NR GBS:  Positive GC:  Neg Chlamydia: Neg Genetic screenings:  wnl Glucola:  165  3 hour 81-166-105-120 Other:  TSH 3.58/ 2.32/ 3.72 Hgb 9.7 at NOB, 10.9at 28 weeks       Assessment/Plan: G1P0 at 40.3 IUP in early active labor Cat 1 strip  GBS positive Hypothyroidism  Plan: Pap: Admit to Birthing Suite Routine [facility] orders Pain med/epidural prn PCN G for GBS prophylaxis  Henderson NewcomerNancy Jean ProtheroCNM, MSN 03/20/2018, 2:08 AM

## 2018-03-21 ENCOUNTER — Encounter (HOSPITAL_COMMUNITY): Payer: Self-pay

## 2018-03-21 LAB — CBC
HEMATOCRIT: 32.3 % — AB (ref 36.0–46.0)
HEMOGLOBIN: 10.8 g/dL — AB (ref 12.0–15.0)
MCH: 27.8 pg (ref 26.0–34.0)
MCHC: 33.4 g/dL (ref 30.0–36.0)
MCV: 83 fL (ref 78.0–100.0)
Platelets: 237 10*3/uL (ref 150–400)
RBC: 3.89 MIL/uL (ref 3.87–5.11)
RDW: 14.6 % (ref 11.5–15.5)
WBC: 22 10*3/uL — AB (ref 4.0–10.5)

## 2018-03-21 LAB — HIV ANTIBODY (ROUTINE TESTING W REFLEX): HIV Screen 4th Generation wRfx: NONREACTIVE

## 2018-03-21 MED ORDER — OXYCODONE HCL 5 MG PO TABS: 5.0000 mg | ORAL_TABLET | Freq: Once | ORAL | Status: DC | PRN

## 2018-03-21 MED ORDER — NALOXONE HCL 4 MG/10ML IJ SOLN
1.0000 ug/kg/h | INTRAVENOUS | Status: DC | PRN
  Filled 2018-03-21: qty 5

## 2018-03-21 MED ORDER — SCOPOLAMINE 1 MG/3DAYS TD PT72
1.0000 | MEDICATED_PATCH | Freq: Once | TRANSDERMAL | Status: DC
  Filled 2018-03-21: qty 1

## 2018-03-21 MED ORDER — WITCH HAZEL-GLYCERIN EX PADS: 1.0000 "application " | MEDICATED_PAD | CUTANEOUS | Status: DC | PRN

## 2018-03-21 MED ORDER — OXYTOCIN 40 UNITS IN LACTATED RINGERS INFUSION - SIMPLE MED: 2.5000 [IU]/h | INTRAVENOUS | Status: AC

## 2018-03-21 MED ORDER — SODIUM CHLORIDE 0.9% FLUSH: 3.0000 mL | INTRAVENOUS | Status: DC | PRN

## 2018-03-21 MED ORDER — OXYCODONE HCL 5 MG PO TABS: 10.0000 mg | ORAL_TABLET | ORAL | Status: DC | PRN

## 2018-03-21 MED ORDER — NALBUPHINE HCL 10 MG/ML IJ SOLN: 5.0000 mg | Freq: Once | INTRAMUSCULAR | Status: DC | PRN

## 2018-03-21 MED ORDER — ZOLPIDEM TARTRATE 5 MG PO TABS: 5.0000 mg | ORAL_TABLET | Freq: Every evening | ORAL | Status: DC | PRN

## 2018-03-21 MED ORDER — PRENATAL MULTIVITAMIN CH
1.0000 | ORAL_TABLET | Freq: Every day | ORAL | Status: DC
  Administered 2018-03-21 – 2018-03-22 (×3): 1 via ORAL
  Filled 2018-03-21 (×3): qty 1

## 2018-03-21 MED ORDER — METHYLERGONOVINE MALEATE 0.2 MG PO TABS: 0.2000 mg | ORAL_TABLET | ORAL | Status: DC | PRN

## 2018-03-21 MED ORDER — SENNOSIDES-DOCUSATE SODIUM 8.6-50 MG PO TABS
2.0000 | ORAL_TABLET | ORAL | Status: DC
  Administered 2018-03-21: 2 via ORAL
  Filled 2018-03-21 (×2): qty 2

## 2018-03-21 MED ORDER — DIPHENHYDRAMINE HCL 25 MG PO CAPS: 25.0000 mg | ORAL_CAPSULE | Freq: Four times a day (QID) | ORAL | Status: DC | PRN

## 2018-03-21 MED ORDER — TETANUS-DIPHTH-ACELL PERTUSSIS 5-2.5-18.5 LF-MCG/0.5 IM SUSP: 0.5000 mL | Freq: Once | INTRAMUSCULAR | Status: DC

## 2018-03-21 MED ORDER — IBUPROFEN 600 MG PO TABS
600.0000 mg | ORAL_TABLET | Freq: Four times a day (QID) | ORAL | Status: DC
  Administered 2018-03-21 – 2018-03-23 (×7): 600 mg via ORAL
  Filled 2018-03-21 (×9): qty 1

## 2018-03-21 MED ORDER — ONDANSETRON HCL 4 MG/2ML IJ SOLN
4.0000 mg | Freq: Three times a day (TID) | INTRAMUSCULAR | Status: DC | PRN
  Filled 2018-03-21: qty 2

## 2018-03-21 MED ORDER — ACETAMINOPHEN 10 MG/ML IV SOLN: 1000.0000 mg | Freq: Once | INTRAVENOUS | Status: DC | PRN

## 2018-03-21 MED ORDER — DIPHENHYDRAMINE HCL 25 MG PO CAPS: 25.0000 mg | ORAL_CAPSULE | ORAL | Status: DC | PRN

## 2018-03-21 MED ORDER — SIMETHICONE 80 MG PO CHEW
80.0000 mg | CHEWABLE_TABLET | Freq: Three times a day (TID) | ORAL | Status: DC
  Administered 2018-03-21 – 2018-03-23 (×5): 80 mg via ORAL
  Filled 2018-03-21 (×5): qty 1

## 2018-03-21 MED ORDER — DIPHENHYDRAMINE HCL 50 MG/ML IJ SOLN: 12.5000 mg | INTRAMUSCULAR | Status: DC | PRN

## 2018-03-21 MED ORDER — OXYCODONE HCL 5 MG PO TABS: 5.0000 mg | ORAL_TABLET | ORAL | Status: DC | PRN

## 2018-03-21 MED ORDER — NALBUPHINE HCL 10 MG/ML IJ SOLN: 5.0000 mg | INTRAMUSCULAR | Status: DC | PRN

## 2018-03-21 MED ORDER — COCONUT OIL OIL: 1.0000 "application " | TOPICAL_OIL | Status: DC | PRN

## 2018-03-21 MED ORDER — SIMETHICONE 80 MG PO CHEW
80.0000 mg | CHEWABLE_TABLET | ORAL | Status: DC
  Administered 2018-03-22 (×2): 80 mg via ORAL
  Filled 2018-03-21 (×2): qty 1

## 2018-03-21 MED ORDER — PRENATAL MULTIVITAMIN CH
1.0000 | ORAL_TABLET | Freq: Every day | ORAL | Status: DC
  Filled 2018-03-21: qty 1

## 2018-03-21 MED ORDER — DIBUCAINE 1 % RE OINT: 1.0000 "application " | TOPICAL_OINTMENT | RECTAL | Status: DC | PRN

## 2018-03-21 MED ORDER — SIMETHICONE 80 MG PO CHEW: 80.0000 mg | CHEWABLE_TABLET | ORAL | Status: DC | PRN

## 2018-03-21 MED ORDER — ACETAMINOPHEN 325 MG PO TABS
650.0000 mg | ORAL_TABLET | ORAL | Status: DC | PRN
  Administered 2018-03-21 – 2018-03-23 (×3): 650 mg via ORAL
  Filled 2018-03-21 (×3): qty 2

## 2018-03-21 MED ORDER — OXYCODONE HCL 5 MG/5ML PO SOLN: 5.0000 mg | Freq: Once | ORAL | Status: DC | PRN

## 2018-03-21 MED ORDER — LACTATED RINGERS IV SOLN
INTRAVENOUS | Status: DC
  Administered 2018-03-21: 11:00:00 via INTRAVENOUS

## 2018-03-21 MED ORDER — LEVOTHYROXINE SODIUM 75 MCG PO TABS
75.0000 ug | ORAL_TABLET | Freq: Every day | ORAL | Status: DC
  Administered 2018-03-21 – 2018-03-23 (×3): 75 ug via ORAL
  Filled 2018-03-21 (×3): qty 1

## 2018-03-21 MED ORDER — HYDROMORPHONE HCL 1 MG/ML IJ SOLN: 0.2500 mg | INTRAMUSCULAR | Status: DC | PRN

## 2018-03-21 MED ORDER — NALOXONE HCL 0.4 MG/ML IJ SOLN: 0.4000 mg | INTRAMUSCULAR | Status: DC | PRN

## 2018-03-21 MED ORDER — METHYLERGONOVINE MALEATE 0.2 MG/ML IJ SOLN: 0.2000 mg | INTRAMUSCULAR | Status: DC | PRN

## 2018-03-21 MED ORDER — MENTHOL 3 MG MT LOZG: 1.0000 | LOZENGE | OROMUCOSAL | Status: DC | PRN

## 2018-03-21 MED ORDER — PROMETHAZINE HCL 25 MG/ML IJ SOLN: 6.2500 mg | INTRAMUSCULAR | Status: DC | PRN

## 2018-03-21 MED ORDER — FERROUS SULFATE 325 (65 FE) MG PO TABS
325.0000 mg | ORAL_TABLET | Freq: Two times a day (BID) | ORAL | Status: DC
  Administered 2018-03-21 – 2018-03-23 (×3): 325 mg via ORAL
  Filled 2018-03-21 (×3): qty 1

## 2018-03-21 NOTE — Brief Op Note (Signed)
03/20/2018 - 03/21/2018  12:36 AM  PATIENT:  Pennie BanterShivangi Jaffee  32 y.o. female  PRE-OPERATIVE DIAGNOSIS:  IUP @ 40 3/7 weeks,  nonreassuring fetal tracing, suspect malpresentation  POST-OPERATIVE DIAGNOSIS: Same, nuchal cord x 1, Transverse presentation  PROCEDURE:  Procedure(s): CESAREAN SECTION (N/A), Primary  SURGEON:  Surgeon(s) and Role:    Geryl Rankins* Amory Simonetti, MD - Primary  PHYSICIAN ASSISTANT:   ASSISTANTS: Illene BolusLori Clemmons, CNM, Technician   ANESTHESIA:   epidural  EBL:  993 mL   BLOOD ADMINISTERED:none  DRAINS: Urinary Catheter (Foley)   LOCAL MEDICATIONS USED:  NONE  SPECIMEN:  Source of Specimen:  Placenta  DISPOSITION OF SPECIMEN:  PATHOLOGY  COUNTS:  YES  TOURNIQUET:  * No tourniquets in log *  DICTATION: .Other Dictation: Dictation Number [id number]  PLAN OF CARE: Transfer to PACU then postpartum  PATIENT DISPOSITION:  PACU - hemodynamically stable.   Delay start of Pharmacological VTE agent (>24hrs) due to surgical blood loss or risk of bleeding: yes

## 2018-03-21 NOTE — Transfer of Care (Signed)
Immediate Anesthesia Transfer of Care Note  Patient: [name]  Procedure(s) Performed: CESAREAN SECTION (N/A )  Patient Location: PACU  Anesthesia Type:Epidural  Level of Consciousness: awake, alert  and oriented  Airway & Oxygen Therapy: Patient Spontanous Breathing  Post-op Assessment: Report given to RN and Post -op Vital signs reviewed and stable  Post vital signs: Reviewed and stable  Last Vitals:  Vitals Value Taken Time  BP 104/63 03/21/2018 12:38 AM  Temp    Pulse 74 03/21/2018 12:40 AM  Resp 19 03/21/2018 12:40 AM  SpO2 100 % 03/21/2018 12:40 AM  Vitals shown include unvalidated device data.  Last Pain:  Vitals:   03/20/18 2206  TempSrc: Axillary  PainSc:          Complications: No apparent anesthesia complications

## 2018-03-21 NOTE — Op Note (Signed)
[name], [name] MEDICAL RECORD ZO:[id number] ACCOUNT [id number] DATE OF BIRTH:1986/05/14 FACILITY: [facility] LOCATION: [id number] PHYSICIAN:Luka Reisch Derrell Lolling, MD  OPERATIVE REPORT  DATE OF PROCEDURE:  03/20/2018  PREOPERATIVE DIAGNOSES:  Intrauterine pregnancy at 40-3/7 weeks, nonreassuring fetal heart tracing, suspect malpresentation.  POSTOPERATIVE DIAGNOSES:  Intrauterine pregnancy at 40-3/7 weeks, nonreassuring fetal heart tracing, suspect malpresentation.  Nuchal cord x1.  Transverse presentation.  PROCEDURE:  Primary low transverse cesarean section with 2 layer closure.  SURGEON:  Geryl Rankins, MD  ASSISTANT:  Lucila Maine, certified nurse midwife and technician.  ANESTHESIA:  Epidural.  ESTIMATED BLOOD LOSS:  993  BLOOD ADMINISTERED:  None.  DRAINS:  Foley catheter.  LOCAL:  None.  SPECIMENS:  Placenta.  DISPOSITION OF SPECIMEN:  To pathology.  PATIENT DISPOSITION:  To PACU hemodynamically stable.  COMPLICATIONS:  None.  FINDINGS:  Viable female infant with nuchal cord x1 in the vertex position.  Transverse and then rotated to OP.  Clear fluid, terminal meconium.  Cord pH 7.09.  Normal uterus, fallopian tubes and ovaries bilaterally.  INDICATIONS:  The patient is a gravida 1 who presented at 40-2/7 weeks in spontaneous labor.  Her labor was augmented with Pitocin.  She had intermittent decelerations.  Pitocin was discontinued twice.  Ultimately, she had a protracted labor course  towards from 9 to complete.  She started having decelerations when she started to push that resolved when she was not pushing.  Prior to counseling the patient, her temperature was 100.1 and baby was tachycardic.  She received Tylenol.  She was GBS  positive and had been receiving penicillin IV.  She was consented for C-section versus vacuum.  I suspected that the baby had a transverse presentation and I would attempt to rotate and then apply the Kiwi vacuum.  Prior to even starting the  vacuum, the  patient had some decelerations spontaneously with contractions.  Vacuum was applied and there were 3 series and there was minimal descent.  I was unable to rotate the baby up fully.  Due to protracted labor course and the minimal descent with the vacuum,  I consented the patient for primary C-section.  DESCRIPTION OF PROCEDURE:  The patient was taken to the operating room with IV running.  Fetal heart tones intraoperative were 118.  A Foley catheter was placed and she was prepped and draped in normal sterile fashion.  A  timeout was performed.  SCDs were on her legs and operating.  She received Ancef 2 grams IV prior to the start of the procedure.  Abdomen was marked.  Pfannenstiel skin incision was made with a scalpel and carried down to the underlying layer of the  fascia with the Bovie.  Bovie was used to incise the fascia.  An incision was extended laterally with curved Mayo scissors.  Rectus muscles were separated at the midline.  Peritoneum was identified and tented up with hemostat x2 and entered sharply.   Peritoneum was stretched and serosanguineous intraabdominal fluid noted.  No bladder injury noted.  Alexis retractor was placed after palpating the uterus.  A bladder flap was easily developed and displaced below the lower uterine segment.  A transverse incision was made with a scalpel and extended bluntly.  As soon as the uterus was opened, the baby's  arms and hands came through the incision.  Caput was noted.  I attempted to tuck the arms back in so that I could push the baby up and deliver the baby.  I was eventually able to do so but  the arms kept coming out.  Eventually, the baby's head was in  the OP presentation and the vacuum was applied to right the baby and get the baby out.  Nuchal cord x1 was loose at that time.  When I initially put my hand inside, my hand was between the cord and the baby's head.  Cord was clamped x2 and cut.  Baby was floppy, handed off to the  waiting NICU team.  Cord pH was obtained.  Placenta was then delivered spontaneously and the uterus was cleared of all clots and debris with moist laparotomy sponge.  Ring forceps were used  to minimize bleeding.  The incision extended significantly laterally.  The uterines were not involved, but it looked like there was the ureter, so I grabbed the tip of the edge with an Allis clamp and then I closed the first layer with a 0 Vicryl in a  continuous locked fashion.  Second layer of the same suture was used and did not go as far lateral and that suture was used for imbrication.  The uterus was cleared of all clots and debris prior to closing.  A moist laparotomy sponge was placed in the  abdomen to retract the intestines.  The abdomen was then irrigated.  Peritoneum was closed with 2-0 Vicryl in a continuous locked fashion.  Cautery was used as needed for hemostasis on the fascia and the muscles.  The fascia was then reapproximated with 0 Vicryl in a continuous fashion.  Adipose subcutaneous layer was  reapproximated with 2-0 plain gut and skin was reapproximated with 4-0 Monocryl.  Steri-Strips, pressure and benzoin were to be applied.  The patient tolerated the procedure well.  Baby remained in the OR with the family while we completed the procedure.  TN/NUANCE  D:03/21/2018 T:03/21/2018 JOB:[id number]

## 2018-03-21 NOTE — Anesthesia Postprocedure Evaluation (Signed)
Anesthesia Post Note  Patient: [name]  Procedure(s) Performed: CESAREAN SECTION (N/A )     Patient location during evaluation: PACU Anesthesia Type: Epidural Level of consciousness: awake and alert Pain management: pain level controlled Vital Signs Assessment: post-procedure vital signs reviewed and stable Respiratory status: spontaneous breathing, nonlabored ventilation and respiratory function stable Cardiovascular status: stable Postop Assessment: no headache, no backache and epidural receding Anesthetic complications: no    Last Vitals:  Vitals:   03/21/18 0251 03/21/18 0350  BP: 109/71 105/69  Pulse: 67 66  Resp: 20 20  Temp: 36.8 C 36.8 C  SpO2: 99% 100%    Last Pain:  Vitals:   03/21/18 0350  TempSrc: Oral  PainSc: 0-No pain   Pain Goal:                 Catheryn Baconyan P Ellisa Devivo

## 2018-03-21 NOTE — Anesthesia Postprocedure Evaluation (Signed)
Anesthesia Post Note  Patient: Pennie BanterShivangi Lofland  Procedure(s) Performed: CESAREAN SECTION (N/A )     Patient location during evaluation: Mother Baby Anesthesia Type: Epidural Level of consciousness: awake, awake and alert and oriented Pain management: pain level controlled Vital Signs Assessment: post-procedure vital signs reviewed and stable Respiratory status: spontaneous breathing, nonlabored ventilation and respiratory function stable Cardiovascular status: stable Postop Assessment: no headache, no backache, patient able to bend at knees, no apparent nausea or vomiting, able to ambulate and adequate PO intake Anesthetic complications: no    Last Vitals:  Vitals:   03/21/18 0350 03/21/18 0452  BP: 105/69 107/62  Pulse: 66 66  Resp: 20 18  Temp: 36.8 C 36.7 C  SpO2: 100% 100%    Last Pain:  Vitals:   03/21/18 0452  TempSrc: Oral  PainSc: 0-No pain   Pain Goal:                 Marijah Larranaga

## 2018-03-21 NOTE — Addendum Note (Signed)
Addendum  created 03/21/18 0756 by Elbert Ewingshymer, Lakysha Kossman S, CRNA   Sign clinical note

## 2018-03-21 NOTE — Lactation Note (Signed)
This note was copied from a baby's chart. Lactation Consultation Note; Lactation brochure given with basic teaching done.  Assist mother with breast massage and hand expression. Mother is unable to hand express colostrum. Mother reports that she saw colostrum in infants mouth when feeding attempt .  Several attempts to latch infant . Infant took a few sucks but no sustained latch. Mothers nipples are erect but when compressed her nipple inverts.  Staff nurse sat up a DEBP and assist mother with pumping . No observed colostrum .  Advised mother that she should attempt to breastfeed infant on cue and feed at least 8-12 times in 24 hours.  Discussed the use of formula if infant doesn't latch the next feeding. Informed mother that infant could be fed with a curved tip syringe or cup. Advised mother to continue to pump breast every 2-3 hours and continue to massage breast and hand express.  Mother became anxious with infant crying. GMOB took infant and swaddled her. Advised mother to pump again . Encouraged frequent skin to skin.  Mother is aware of available LC services. .   Patient Name: Girl Pennie BanterShivangi Cuellar NGEXB'MToday's Date: 03/21/2018 Reason for consult: Initial assessment   Maternal Data    Feeding Feeding Type: Formula Length of feed: 0 min  LATCH Score Latch: Too sleepy or reluctant, no latch achieved, no sucking elicited.  Audible Swallowing: None  Type of Nipple: Everted at rest and after stimulation(mothers nipples erect but inverts when compressed)  Comfort (Breast/Nipple): Soft / non-tender  Hold (Positioning): Full assist, staff holds infant at breast  LATCH Score: 4  Interventions Interventions: Breast feeding basics reviewed;Assisted with latch;Skin to skin;Hand express;Adjust position;Support pillows;Position options;DEBP  Lactation Tools Discussed/Used Tools: Pump;Nipple Shields Nipple shield size: 20 Breast pump type: Double-Electric Breast Pump Initiated by:: Raeford Razoreven  Efird,RN Date initiated:: 03/21/18   Consult Status Consult Status: Follow-up Date: 03/22/18 Follow-up type: In-patient    Stevan BornKendrick, Moet Mikulski [facility] 03/21/2018, 5:16 PM

## 2018-03-21 NOTE — Progress Notes (Signed)
Subjective: Postop Day 1: Cesarean Delivery No complaints.  Pain controlled.  Lochia normal.  Breast feeding Yes.  Ambulating in room  Objective: Temp:  [97.7 F (36.5 C)-99.9 F (37.7 C)] 98 F (36.7 C) (07/09 1228) Pulse Rate:  [66-104] 68 (07/09 1228) Resp:  [16-25] 20 (07/09 1228) BP: (95-133)/(51-80) 101/60 (07/09 1228) SpO2:  [99 %-100 %] 100 % (07/09 1228)  Physical Exam: Gen: NAD, RN at bedside Lochia: Not visualized Uterine Fundus: firm, appropriately tender Incision: clean, dry and intact, healing well DVT Evaluation:Edema present, no calf tenderness bilaterally   Recent Labs    03/20/18 0215 03/21/18 0555  HGB 11.9* 10.8*  HCT 35.2* 32.3*    Assessment/Plan: Status post C-section-doing well postoperatively. Routine post op care. Encouraged ambulation in halls once Foley removed. Lactation support.    Geryl Rankinsvelyn Aarionna Germer 03/21/2018, 1:49 PM

## 2018-03-22 LAB — CBC WITH DIFFERENTIAL/PLATELET
BASOS ABS: 0 10*3/uL (ref 0.0–0.1)
BASOS PCT: 0 %
EOS ABS: 0.1 10*3/uL (ref 0.0–0.7)
EOS PCT: 1 %
HCT: 25 % — ABNORMAL LOW (ref 36.0–46.0)
Hemoglobin: 8.2 g/dL — ABNORMAL LOW (ref 12.0–15.0)
LYMPHS ABS: 1.5 10*3/uL (ref 0.7–4.0)
Lymphocytes Relative: 12 %
MCH: 27.7 pg (ref 26.0–34.0)
MCHC: 32.8 g/dL (ref 30.0–36.0)
MCV: 84.5 fL (ref 78.0–100.0)
Monocytes Absolute: 0.5 10*3/uL (ref 0.1–1.0)
Monocytes Relative: 4 %
NEUTROS PCT: 83 %
Neutro Abs: 10.4 10*3/uL — ABNORMAL HIGH (ref 1.7–7.7)
PLATELETS: 226 10*3/uL (ref 150–400)
RBC: 2.96 MIL/uL — ABNORMAL LOW (ref 3.87–5.11)
RDW: 14.9 % (ref 11.5–15.5)
WBC: 12.6 10*3/uL — AB (ref 4.0–10.5)

## 2018-03-22 NOTE — Progress Notes (Signed)
Late entry.  Pt seen at ~1:20 pm Subjective: Postop Day 1: Cesarean Delivery No complaints.  Pain controlled.  Lochia normal.  Breast feeding Yes.  Ambulating in room  Objective: Temp:  [97.8 F (36.6 C)-98 F (36.7 C)] 98 F (36.7 C) (07/10 1322) Pulse Rate:  [70-75] 70 (07/10 1322) Resp:  [18] 18 (07/10 1322) BP: (94-101)/(58-63) 101/63 (07/10 1322) SpO2:  [99 %] 99 % (07/09 2339)  Physical Exam: Gen: NAD, RN at bedside Lochia: Not visualized Uterine Fundus: firm, appropriately tender Incision: clean, dry and intact, healing well DVT Evaluation:Edema present, no calf tenderness bilaterally    CBC Latest Ref Rng & Units 03/22/2018 03/21/2018 03/20/2018  WBC 4.0 - 10.5 K/uL 12.6(H) 22.0(H) 8.4  Hemoglobin 12.0 - 15.0 g/dL 8.2(L) 10.8(L) 11.9(L)  Hematocrit 36.0 - 46.0 % 25.0(L) 32.3(L) 35.2(L)  Platelets 150 - 400 K/uL 226 237 268   Assessment/Plan: Status post C-section-doing well postoperatively. Fundal tenderness.  Check CBC. Routine post op care. Encouraged ambulation in halls once Foley removed. Lactation support. Anticipate discharge tomorrow.  Addendum: Start iron.  [name] 03/22/2018, 9:39 PM

## 2018-03-23 MED ORDER — DOCUSATE SODIUM 100 MG PO CAPS: 100.0000 mg | ORAL_CAPSULE | Freq: Two times a day (BID) | ORAL | 0 refills | Status: AC

## 2018-03-23 MED ORDER — FERROUS SULFATE 325 (65 FE) MG PO TABS: 325.0000 mg | ORAL_TABLET | Freq: Every day | ORAL | 3 refills | Status: AC

## 2018-03-23 MED ORDER — IBUPROFEN 600 MG PO TABS: 600.0000 mg | ORAL_TABLET | Freq: Four times a day (QID) | ORAL | 0 refills | Status: DC

## 2018-03-23 MED ORDER — OXYCODONE-ACETAMINOPHEN 5-325 MG PO TABS: 1.0000 | ORAL_TABLET | Freq: Four times a day (QID) | ORAL | 0 refills | Status: AC | PRN

## 2018-03-23 NOTE — Discharge Instructions (Signed)

## 2018-03-23 NOTE — Progress Notes (Addendum)
Postop Day 2: Cesarean Delivery No complaints.  Pain controlled.  Lochia normal.  Breast feeding Tolerating gen diet.  +flatus and BM.  Voiding freely.  No F/C/CP/SOB.  No acute complaints.  Wishes to go home today, if baby is also discharged.    Objective: Temp:  [97.7 F (36.5 C)-98 F (36.7 C)] 97.7 F (36.5 C) (07/11 0524) Pulse Rate:  [68-71] 68 (07/11 0524) Resp:  [16-18] 16 (07/11 0524) BP: (101-109)/(62-63) 103/63 (07/11 0524) SpO2:  [100 %] 100 % (07/11 0524)  Physical Exam: Gen: NAD CV: RRR Lungs: CTAB Abd: soft, non-tender, no rebound, no guarding Uterine Fundus: firm, appropriately tender, below umbilicus Incision: clean, dry and intact, honeycomb in place DVT Evaluation:1+ non-pitting Edema present, no calf tenderness bilaterally    CBC Latest Ref Rng & Units 03/22/2018 03/21/2018 03/20/2018  WBC 4.0 - 10.5 K/uL 12.6(H) 22.0(H) 8.4  Hemoglobin 12.0 - 15.0 g/dL 8.2(L) 10.8(L) 11.9(L)  Hematocrit 36.0 - 46.0 % 25.0(L) 32.3(L) 35.2(L)  Platelets 150 - 400 K/uL 226 237 268   Assessment/Plan: 32yo G1P1 s/p primary C-section, POD#2 -pain well controlled -voiding freely -tolerating gen diet -on iron daily -meeting postop milestones appropriately -Hypothyroidism: continue with levothyroxine  DISPO: Plan for discharge today, pending PEDs  Myna HidalgoJennifer Arda Keadle, DO [phone] (cell) [phone] (office)

## 2018-03-23 NOTE — Lactation Note (Signed)
This note was copied from a baby's chart. Lactation Consultation Note Baby 48 hrs old. Mom hasn't been BF well.  Mom has everted short shaft nipples. Compress inwards for a shorter latch. Lt. Areola had edema. Reverse pressure demonstrated and helpful. Rt. Nipple w/o edema. Lt. Breast significantly larger than Rt. Mom stated she has only been BF to the Lt. Breast because it was difficult.  Discussed feeding positions. Mom has #20 NS. Discussed importance of BF on both sides. Parents using curve tip syring inserting formula into ends of NS. Baby feeding long enough to suckle formula out then stops. Very sleepy.  Baby appears jaundice, parents states TCB WDL. Worked with baby trying to get baby stimulated to feed. SNS started, baby started feeding much better. Demonstrated set up to parents. Changed NS to #16 d/t nipple shrank in #20 during feedings and NS appeared large. Mom stated #16 NS comfortable. Encouraged mom to switch back to #20 to see which feels the best. Parents loves the SNS. Explained to parents temporary until abby feeding better. LC unable to hand express colostrum. Mom is using DEBP collected a little colostrum. Shells given to mom to wear to evert nipple more and assist w/edema. Discussed cleaning of SNS and monitoring for I&O. Patient Name: Colleen Pennie BanterShivangi Sandoval ZOXWR'UToday's Date: 03/23/2018 Reason for consult: Follow-up assessment;[name]  Feeding Feeding Type: Breast Fed Length of feed: 10 min(still feeding)  LATCH Score Latch: Repeated attempts needed to sustain latch, nipple held in mouth throughout feeding, stimulation needed to elicit sucking reflex.  Audible Swallowing: None  Type of Nipple: Everted at rest and after stimulation  Comfort (Breast/Nipple): Filling, red/small blisters or bruises, mild/mod discomfort  Hold  (Positioning): Assistance needed to correctly position infant at breast and maintain latch.  LATCH Score: 5  Interventions Interventions: Breast feeding basics reviewed;Support pillows;Assisted with latch;Position options;Skin to skin;Breast massage;Shells;Hand express;Reverse pressure;Breast compression;DEBP;Adjust position  Lactation Tools Discussed/Used Tools: Shells;Pump;Nipple Dorris CarnesShields;Supplemental Nutrition System Nipple shield size: 16;20 Shell Type: Inverted Breast pump type: Double-Electric Breast Pump   Consult Status Consult Status: Follow-up Date: 03/23/18 Follow-up type: In-patient    Rayn Enderson, Diamond NickelLAURA G 03/23/2018, 12:00 AM

## 2018-03-23 NOTE — Lactation Note (Signed)
This note was copied from a baby's chart. Lactation Consultation Note  Patient Name: [name] ZOXWR'UToday's Date: 03/23/2018 Reason for consult: Follow-up assessment;Term;Infant weight loss;Difficult latch  Baby is 58 hours old,  6% weight loss, using a NS for latching.  As LC entered the room, baby ready to feed.  LC Had mom apply NS #20 and it was a good fit after LC had mom pre-pump with hand pump.  And the NS fit better with the SNS under the NS.  Baby fed for 20 mins, and took 15 ml of formula. Depth achieved.  [facility] and [facility]NS.  Per mom breast are feeling fuller and heavier.  Mom denies soreness, sore nipple and engorgement prevention and tx .  LC plan:  Breast shells between feedings  Prior to latch - breast massage, hand express, pre-pump with hand pump/ hand pump. Apply NS and SNS underneath it.  Latch with firm support. ( supplement with 30 ml )  Post pump after feeding with / both breast.   LC [facility]. ( mom and dad receptive )   Mother informed of post-discharge support and given phone number to the lactation department, including services for phone call assistance; out-patient appointments; and breastfeeding support group. List of other breastfeeding resources in the community given in the handout. Encouraged mother to call for problems or concerns related to breastfeeding.      Maternal Data    Feeding Feeding Type: Breast Milk with Formula added Length of feed: 20 min  LATCH Score Latch: Grasps breast easily, tongue down, lips flanged, rhythmical sucking.  Audible Swallowing: Spontaneous and intermittent  Type of Nipple: Everted at rest and after stimulation  Comfort (Breast/Nipple): Filling, red/small blisters or bruises, mild/mod discomfort  Hold (Positioning): Assistance needed to correctly position infant at breast and maintain latch.  LATCH Score: 8  Interventions Interventions: Breast  feeding basics reviewed  Lactation Tools Discussed/Used Tools: Shells;Pump;Nipple Shields Nipple shield size: 20 Shell Type: Inverted Breast pump type: Double-Electric Breast Pump Pump Review: Milk Storage   Consult Status Consult Status: Follow-up Date: (LC offrered to request LC O/P appt with [facility]/ mom receptive ) Follow-up type: Out-patient    Colleen SprangMargaret Ann Laurin Sandoval 03/23/2018, 10:13 AM

## 2018-03-23 NOTE — Discharge Summary (Signed)
OB Discharge Summary     Patient Name: [name] DOB: [birth year] MRN: [id number]  Date of admission: 03/20/2018 Delivering MD: Geryl RankinsVARNADO, EVELYN   Date of discharge: 03/23/2018  Admitting diagnosis: 40 WEEKS CTX Intrauterine pregnancy: 7025w2d     Secondary diagnosis:  Active Problems:   Normal labor  Additional problems: Hypothyroidism     Discharge diagnosis: Term Pregnancy Delivered, Anemia and hypothyroidism                                                                                                Post partum procedures:n/a  Augmentation: Pitocin  Complications: None  Hospital course:  Onset of Labor With Unplanned C/S  32 y.o. yo G1P1001 at 4525w2d was admitted in Latent Labor on 03/20/2018. Patient had a labor course significant for non-reassuring fetal heart tones. Membrane Rupture Time/Date: 1:49 PM ,03/20/2018   The patient went for cesarean section due to Non-Reassuring FHR, and delivered a Viable infant,03/20/2018  Details of operation can be found in separate operative note. Patient had an uncomplicated postpartum course.  She is ambulating,tolerating a regular diet, passing flatus, and urinating well.  Patient is discharged home in stable condition 03/23/18.  Physical exam  Vitals:   03/22/18 1321 03/22/18 1322 03/22/18 2150 03/23/18 0524  BP: 101/63 101/63 109/62 103/63  Pulse: 70 70 71 68  Resp: 18 18 18 16   Temp: 98 F (36.7 C) 98 F (36.7 C) 97.8 F (36.6 C) 97.7 F (36.5 C)  TempSrc: Oral Oral Oral Oral  SpO2:   100% 100%  Weight:      Height:       General: alert, cooperative and no distress Lochia: appropriate Uterine Fundus: firm Incision: Dressing is clean, dry, and intact DVT Evaluation: No evidence of DVT seen on physical exam. Labs: Lab Results  Component Value Date   WBC 12.6 (H) 03/22/2018   HGB 8.2 (L) 03/22/2018   HCT 25.0 (L) 03/22/2018   MCV 84.5 03/22/2018   PLT 226 03/22/2018   No flowsheet data found.  Discharge instruction:  per After Visit Summary and "Baby and Me Booklet".  After visit meds:  Allergies as of 03/23/2018   No Known Allergies     Medication List    TAKE these medications   docusate sodium 100 MG capsule Commonly known as:  COLACE Take 1 capsule (100 mg total) by mouth 2 (two) times daily.   ferrous sulfate 325 (65 FE) MG tablet Take 1 tablet (325 mg total) by mouth daily with breakfast.   ibuprofen 600 MG tablet Commonly known as:  ADVIL,MOTRIN Take 1 tablet (600 mg total) by mouth every 6 (six) hours.   levothyroxine 75 MCG tablet Commonly known as:  SYNTHROID, LEVOTHROID Take 75 mcg by mouth daily before breakfast.   oxyCODONE-acetaminophen 5-325 MG tablet Commonly known as:  PERCOCET Take 1 tablet by mouth every 6 (six) hours as needed for severe pain.   prenatal multivitamin Tabs tablet Take 1 tablet by mouth daily at 12 noon.       Diet: routine diet  Activity: Advance as tolerated. Pelvic rest for 6  weeks.   Outpatient follow up:2 weeks Follow up Appt:No future appointments. Follow up Visit:No follow-ups on file.  Postpartum contraception: Undecided  Newborn Data: Live born female  Birth Weight: 7 lb 2.1 oz (3235 g) APGAR: 6, 8  Newborn Delivery   Birth date/time:  03/20/2018 23:39:00 Delivery type:  C-Section, Vacuum Assisted Trial of labor:  Yes C-section categorization:  Primary     Baby Feeding: Breast Disposition:home with mother   03/23/2018 Sharon Seller, DO

## 2019-11-22 ENCOUNTER — Ambulatory Visit: Payer: Self-pay | Attending: Internal Medicine

## 2019-11-22 DIAGNOSIS — Z23 Encounter for immunization: Secondary | ICD-10-CM

## 2019-11-22 NOTE — Progress Notes (Signed)
   Covid-19 Vaccination Clinic  Name:  Atonya Templer    MRN: [id number] DOB: 1986-04-19  11/22/2019  Ms. Fanguy was observed post Covid-19 immunization for 15 minutes without incident. She was provided with Vaccine Information Sheet and instruction to access the V-Safe system.   Ms. Speakman was instructed to call 911 with any severe reactions post vaccine: Marland Kitchen Difficulty breathing  . Swelling of face and throat  . A fast heartbeat  . A bad rash all over body  . Dizziness and weakness   Immunizations Administered    Name Date Dose VIS Date Route   Pfizer COVID-19 Vaccine 11/22/2019  3:36 PM 0.3 mL 08/24/2019 Intramuscular   Manufacturer: [employer], [employer]   Lot: [id number]   NDC: [address]-6

## 2019-12-17 ENCOUNTER — Ambulatory Visit: Payer: Self-pay | Attending: Internal Medicine

## 2019-12-17 DIAGNOSIS — Z23 Encounter for immunization: Secondary | ICD-10-CM

## 2019-12-17 NOTE — Progress Notes (Signed)
   Covid-19 Vaccination Clinic  Name:  [name]    MRN: [id number] DOB: [birth year]  12/17/2019  [name] was observed post Covid-19 immunization for 15 minutes without incident. She was provided with Vaccine Information Sheet and instruction to access the V-Safe system.   [name] was instructed to call 911 with any severe reactions post vaccine: Marland Kitchen Difficulty breathing  . Swelling of face and throat  . A fast heartbeat  . A bad rash all over body  . Dizziness and weakness   Immunizations Administered    Name Date Dose VIS Date Route   Pfizer COVID-19 Vaccine 12/17/2019 11:08 AM 0.3 mL 08/24/2019 Intramuscular   Manufacturer: [employer], [employer]   Lot: [id number]   NDC: [address]-8

## 2020-05-07 ENCOUNTER — Inpatient Hospital Stay (HOSPITAL_COMMUNITY)
Admission: EM | Admit: 2020-05-07 | Discharge: 2020-05-08 | Disposition: A | Discharge status: Home / Self Care | Payer: BC Managed Care – PPO | Attending: Obstetrics and Gynecology | Admitting: Obstetrics and Gynecology

## 2020-05-07 ENCOUNTER — Emergency Department (HOSPITAL_COMMUNITY): Payer: BC Managed Care – PPO

## 2020-05-07 ENCOUNTER — Encounter (HOSPITAL_COMMUNITY): Payer: Self-pay | Admitting: Emergency Medicine

## 2020-05-07 DIAGNOSIS — R1032 Left lower quadrant pain: Secondary | ICD-10-CM | POA: Diagnosis not present

## 2020-05-07 DIAGNOSIS — O00202 Left ovarian pregnancy without intrauterine pregnancy: Secondary | ICD-10-CM | POA: Diagnosis not present

## 2020-05-07 DIAGNOSIS — N838 Other noninflammatory disorders of ovary, fallopian tube and broad ligament: Secondary | ICD-10-CM | POA: Diagnosis not present

## 2020-05-07 DIAGNOSIS — O26891 Other specified pregnancy related conditions, first trimester: Secondary | ICD-10-CM | POA: Diagnosis not present

## 2020-05-07 DIAGNOSIS — Z3A00 Weeks of gestation of pregnancy not specified: Secondary | ICD-10-CM | POA: Diagnosis not present

## 2020-05-07 DIAGNOSIS — R102 Pelvic and perineal pain: Secondary | ICD-10-CM

## 2020-05-07 DIAGNOSIS — O009 Unspecified ectopic pregnancy without intrauterine pregnancy: Secondary | ICD-10-CM | POA: Insufficient documentation

## 2020-05-07 LAB — I-STAT BETA HCG BLOOD, ED (MC, WL, AP ONLY): I-stat hCG, quantitative: 52.1 m[IU]/mL — ABNORMAL HIGH (ref <5)

## 2020-05-07 NOTE — ED Notes (Signed)
Left ovarian mass noted on ultrasound, called to Dr. Stevie Kern.

## 2020-05-07 NOTE — ED Notes (Signed)
Pt taken via wheelchair to MAU. Dr. Stevie Kern with handoff report.

## 2020-05-07 NOTE — ED Provider Notes (Signed)
Brief update note  I was notified by RN Juleen China - TVUS with ovarian mass, initial POC urine preg was negative but istat beta was 52. Concern for ectopic pregnancy. Upon hearing this information, I reviewed with Williams CNM at [facility]. She accepts patient. Kohut took directly over to [facility]. I completed a very brief assessment at 10:53 PM. Patient well appearing and appropriate for transfer to [facility]. I did not complete a full history or physical exam.   Milagros Loll, MD 05/07/20 2300

## 2020-05-07 NOTE — ED Triage Notes (Signed)
Emergency Medicine Provider Triage Evaluation Note  Contrina Orona , a 34 y.o. female  was evaluated in triage.  Pt complains of pelvic pain.  Review of Systems  Positive: Pelvic pain Negative:   Physical Exam  BP 117/74 (BP Location: Right Arm)   Pulse 70   Temp 98.8 F (37.1 C) (Oral)   Resp 15   LMP 03/13/2020 (Within Weeks)   SpO2 100%  Gen:   Awake, no distress   HEENT:  Atraumatic  Resp:  Normal effort  Cardiac:  Normal rate  Abd:   Nondistended, nontender  MSK:   Moves extremities without difficulty  Neuro:  Speech clear   Medical Decision Making  Medically screening exam initiated at 10:52PM .  Appropriate orders placed.  Kendell Alaimo was informed that the remainder of the evaluation will be completed by another provider, this initial triage assessment does not replace that evaluation, and the importance of remaining in the ED until their evaluation is complete.  Clinical Impression  Likely ectopic pregnancy      I was notified by RN Juleen China - TVUS with ovarian mass, initial POC urine preg was negative but istat beta was 52. Concern for ectopic pregnancy. Upon hearing this information, I reviewed with Williams CNM at [facility]U. She accepts patient. Kohut took directly over to [facility]U. I completed a very brief assessment at 10:53 PM in [facility]. Patient well appearing and appropriate for transfer to MAU. I did not complete a full history or physical exam.   Milagros Loll, MD 05/07/20 2309

## 2020-05-07 NOTE — MAU Provider Note (Addendum)
Chief Complaint: Vaginal Bleeding   First Provider Initiated Contact with Patient 05/07/20 2334        SUBJECTIVE HPI: [name] is a 34 y.o. G2P1001 at [redacted]w[redacted]d by LMP who presents to maternity admissions reporting severe pain in left lower abdomen today.  Has had some pain and bleeding since Saturday, and bleeding got worse yesterday. . She denies vaginal itching/burning, urinary symptoms, h/a, dizziness, n/v, or fever/chills.    Vaginal Bleeding The patient's primary symptoms include pelvic pain and vaginal bleeding. The patient's pertinent negatives include no genital itching, genital lesions or genital odor. This is a new problem. The current episode started in the past 7 days. The problem occurs constantly. The problem has been unchanged. The pain is severe. The problem affects the left side. She is pregnant. Associated symptoms include abdominal pain. Pertinent negatives include no chills, constipation, diarrhea, dysuria, fever, frequency, headaches, nausea or vomiting. The vaginal discharge was bloody. The vaginal bleeding is lighter than menses. She has not been passing clots. She has not been passing tissue. Nothing aggravates the symptoms. She has tried nothing for the symptoms.  Abdominal Pain This is a new problem. The current episode started in the past 7 days. The problem occurs intermittently. The problem has been waxing and waning. The pain is located in the LLQ. The pain is severe. The quality of the pain is cramping and sharp. The abdominal pain does not radiate. Pertinent negatives include no constipation, diarrhea, dysuria, fever, frequency, headaches, nausea or vomiting. The pain is aggravated by palpation and movement. The pain is relieved by nothing.   RN note: Had positive upt Sat and started bleeding some Sat afternoon. Yesterday I thought I had started my period so assumed I had miscarried. Today having scant bleeding but "unbearable" pain LLQ of abdomen  Past Medical  History:  Diagnosis Date  . Medical history non-contributory    Past Surgical History:  Procedure Laterality Date  . CESAREAN SECTION N/A 03/20/2018   Procedure: CESAREAN SECTION;  Surgeon: Geryl Rankins, MD;  Location: [facility];  Service: Obstetrics;  Laterality: N/A;  . NO PAST SURGERIES     Social History   Socioeconomic History  . Marital status: Married    Spouse name: Not on file  . Number of children: Not on file  . Years of education: Not on file  . Highest education level: Not on file  Occupational History  . Not on file  Tobacco Use  . Smoking status: Never Smoker  . Smokeless tobacco: Never Used  Substance and Sexual Activity  . Alcohol use: No  . Drug use: No  . Sexual activity: Yes  Other Topics Concern  . Not on file  Social History Narrative  . Not on file   Social Determinants of Health   Financial Resource Strain:   . Difficulty of Paying Living Expenses: Not on file  Food Insecurity:   . Worried About Programme researcher, broadcasting/film/video in the Last Year: Not on file  . Ran Out of Food in the Last Year: Not on file  Transportation Needs:   . Lack of Transportation (Medical): Not on file  . Lack of Transportation (Non-Medical): Not on file  Physical Activity:   . Days of Exercise per Week: Not on file  . Minutes of Exercise per Session: Not on file  Stress:   . Feeling of Stress : Not on file  Social Connections:   . Frequency of Communication with Friends and Family: Not on file  .  Frequency of Social Gatherings with Friends and Family: Not on file  . Attends Religious Services: Not on file  . Active Member of Clubs or Organizations: Not on file  . Attends BankerClub or Organization Meetings: Not on file  . Marital Status: Not on file  Intimate Partner Violence:   . Fear of Current or Ex-Partner: Not on file  . Emotionally Abused: Not on file  . Physically Abused: Not on file  . Sexually Abused: Not on file   No current facility-administered  medications on file prior to encounter.   Current Outpatient Medications on File Prior to Encounter  Medication Sig Dispense Refill  . docusate sodium (COLACE) 100 MG capsule Take 1 capsule (100 mg total) by mouth 2 (two) times daily. 30 capsule 0  . ferrous sulfate 325 (65 FE) MG tablet Take 1 tablet (325 mg total) by mouth daily with breakfast. 30 tablet 3  . ibuprofen (ADVIL,MOTRIN) 600 MG tablet Take 1 tablet (600 mg total) by mouth every 6 (six) hours. 30 tablet 0  . levothyroxine (SYNTHROID, LEVOTHROID) 75 MCG tablet Take 75 mcg by mouth daily before breakfast.    . oxyCODONE-acetaminophen (PERCOCET) 5-325 MG tablet Take 1 tablet by mouth every 6 (six) hours as needed for severe pain. 15 tablet 0  . Prenatal Vit-Fe Fumarate-FA (PRENATAL MULTIVITAMIN) TABS tablet Take 1 tablet by mouth daily at 12 noon.     No Known Allergies  I have reviewed patient's Past Medical Hx, Surgical Hx, Family Hx, Social Hx, medications and allergies.   ROS:  Review of Systems  Constitutional: Negative for chills and fever.  Gastrointestinal: Positive for abdominal pain. Negative for constipation, diarrhea, nausea and vomiting.  Genitourinary: Positive for pelvic pain and vaginal bleeding. Negative for dysuria and frequency.  Neurological: Negative for headaches.   Review of Systems  Other systems negative   Physical Exam  Physical Exam Patient Vitals for the past 24 hrs:  BP Temp Temp src Pulse Resp SpO2 Height Weight  05/07/20 2324 108/66 98 F (36.7 C) -- 75 18 -- 5\' 2"  (1.575 m) 66.7 kg  05/07/20 1853 117/74 98.8 F (37.1 C) Oral 70 15 100 % -- --   Constitutional: Well-developed, well-nourished female in no acute distress.  Cardiovascular: normal rate Respiratory: normal effort GI: Abd soft, very tender on LLQ.  Mild guarding.Marland Kitchen. Pos BS x 4 MS: Extremities nontender, no edema, normal ROM Neurologic: Alert and oriented x 4.  GU: Neg CVAT.  PELVIC EXAM: deferred secondary to known ectopic  pregnancy  LAB RESULTS Results for orders placed or performed during the hospital encounter of 05/07/20 (from the past 24 hour(s))  I-Stat beta hCG blood, ED     Status: Abnormal   Collection Time: 05/07/20  7:15 PM  Result Value Ref Range   I-stat hCG, quantitative 52.1 (H) <5 mIU/mL   Comment 3          CBC with Differential     Status: Abnormal   Collection Time: 05/08/20 12:12 AM  Result Value Ref Range   WBC 9.1 4.0 - 10.5 K/uL   RBC 4.30 3.87 - 5.11 MIL/uL   Hemoglobin 11.4 (L) 12.0 - 15.0 g/dL   HCT 16.134.9 (L) 36 - 46 %   MCV 81.2 80.0 - 100.0 fL   MCH 26.5 26.0 - 34.0 pg   MCHC 32.7 30.0 - 36.0 g/dL   RDW 09.612.9 04.511.5 - 40.915.5 %   Platelets 323 150 - 400 K/uL   nRBC 0.0 0.0 -  0.2 %   Neutrophils Relative % 66 %   Neutro Abs 6.0 1.7 - 7.7 K/uL   Lymphocytes Relative 27 %   Lymphs Abs 2.4 0.7 - 4.0 K/uL   Monocytes Relative 6 %   Monocytes Absolute 0.5 0 - 1 K/uL   Eosinophils Relative 1 %   Eosinophils Absolute 0.1 0 - 0 K/uL   Basophils Relative 0 %   Basophils Absolute 0.0 0 - 0 K/uL   Immature Granulocytes 0 %   Abs Immature Granulocytes 0.03 0.00 - 0.07 K/uL  Basic metabolic panel     Status: Abnormal   Collection Time: 05/08/20 12:12 AM  Result Value Ref Range   Sodium 137 135 - 145 mmol/L   Potassium 3.8 3.5 - 5.1 mmol/L   Chloride 105 98 - 111 mmol/L   CO2 23 22 - 32 mmol/L   Glucose, Bld 108 (H) 70 - 99 mg/dL   BUN 5 (L) 6 - 20 mg/dL   Creatinine, Ser 7.62 0.44 - 1.00 mg/dL   Calcium 8.9 8.9 - 83.1 mg/dL   GFR calc non Af Amer >60 >60 mL/min   GFR calc Af Amer >60 >60 mL/min   Anion gap 9 5 - 15  Type and screen [facility]     Status: None   Collection Time: 05/08/20 12:12 AM  Result Value Ref Range   ABO/RH(D) AB POS    Antibody Screen NEG    Sample Expiration      05/11/2020,2359 Performed at [facility], [address] [place], [place] [address]     IMAGING US OB LESS THAN 14 WEEKS W/ OB TRANSVAGINAL AND  DOPPLER  Result Date: 05/07/2020 CLINICAL DATA:  34 year old pregnant female with pelvic pain. EXAM: OBSTETRIC <14 WK [place] AND TRANSVAGINAL OB US DOPPLER ULTRASOUND OF OVARIES TECHNIQUE: Both transabdominal and transvaginal ultrasound examinations were performed for complete evaluation of the gestation as well as the maternal uterus, adnexal regions, and pelvic cul-de-sac. Transvaginal technique was performed to assess early pregnancy. Color and duplex Doppler ultrasound was utilized to evaluate blood flow to the ovaries. COMPARISON:  None. FINDINGS: The uterus is retroverted and appears unremarkable. The endometrium measures 4 mm in thickness and appears unremarkable. No intrauterine pregnancy identified. The right ovary is unremarkable and measures 2.5 x 2.3 x 2.4 cm. The left ovary measures 3.5 x 3.5 x 2.7 cm. There is a 3.3 x 1.9 x 2.9 cm echogenic mass inferior to the left ovary which demonstrates internal vascularity. This mass appears to be separate from the left ovary and may represent an ectopic pregnancy, possibly tubal. Clinical correlation and obstetrical consult is advised. Trace free fluid in the pelvis. Pulsed Doppler evaluation of both ovaries demonstrates normal appearing low-resistance arterial and venous waveforms. IMPRESSION: No intrauterine pregnancy identified. An echogenic mass inferior to the left ovary is concerning for an ectopic pregnancy. Clinical correlation and obstetrical consult is advised. These results were called by telephone at the time of interpretation on 05/07/2020 at 10:40 pm to nurse Juleen China who verbally acknowledged these results. Electronically Signed   By: Elgie Collard M.D.   On: 05/07/2020 22:50    MAU Management/MDM: Reviewed labs and US done in ED prior to arrival  Labs are normal but HCG is low. US showed a probable ectopic pregnancy on left.  Consult Dr Sallye Ober (on call for Dr Dion Body) with presentation, exam findings, and results.  She recommends either MTX or  surgery.   Treatments in MAU included  Fentanyl for pain, which did provide relief. .   This bleeding/pain can represent a normal pregnancy with bleeding, spontaneous abortion or even an ectopic which can be life-threatening.  The process as listed above helps to determine which of these is present.  Discussed options of Methotrexate vs surgical management.  Risks and benefits of each reviewed.  She elects to try Methotrexate.  We administered per protocol after labs reviewed. Precautions reviewed.   Upon reassessment pain was mostly gone.  States even before pain med, the pain would come and go and was not constant.   Reviewed very strict precautions for return, stop ibuprofen and prenatal vitamins. Patient is to call Dr Dion Body in AM to update her on her status  ASSESSMENT Pregnancy at [redacted]w[redacted]d by LMP Left ectopic pregnancy No sign of internal bleeding Status post methotrexate therapy  PLAN Discharge home Patient is to call Dr Dion Body in AM to update on her status Pt to return to MAU Saturday for Day #4 labs, and Day #7 labs to be done in office Ectopic precautions reviewed.   Pt stable at time of discharge. Encouraged to return here or to other Urgent Care/ED if she develops worsening of symptoms, increase in pain, fever, or other concerning symptoms.    Wynelle Bourgeois CNM, MSN Certified Nurse-Midwife 05/07/2020  11:34 PM

## 2020-05-07 NOTE — MAU Note (Signed)
Had positive upt Sat and started bleeding some Sat afternoon. Yesterday I thought I had started my period so assumed I had miscarried. Today having scant bleeding but "unbearable" pain LLQ of abdomen

## 2020-05-08 DIAGNOSIS — O00202 Left ovarian pregnancy without intrauterine pregnancy: Secondary | ICD-10-CM | POA: Diagnosis not present

## 2020-05-08 LAB — BASIC METABOLIC PANEL
Anion gap: 9 (ref 5–15)
BUN: 5 mg/dL — ABNORMAL LOW (ref 6–20)
CO2: 23 mmol/L (ref 22–32)
Calcium: 8.9 mg/dL (ref 8.9–10.3)
Chloride: 105 mmol/L (ref 98–111)
Creatinine, Ser: 0.67 mg/dL (ref 0.44–1.00)
GFR calc Af Amer: >60 mL/min (ref >60)
GFR calc non Af Amer: >60 mL/min (ref >60)
Glucose, Bld: 108 mg/dL — ABNORMAL HIGH (ref 70–99)
Potassium: 3.8 mmol/L (ref 3.5–5.1)
Sodium: 137 mmol/L (ref 135–145)

## 2020-05-08 LAB — CBC WITH DIFFERENTIAL/PLATELET
Abs Immature Granulocytes: 0.03 10*3/uL (ref 0.00–0.07)
Basophils Absolute: 0 10*3/uL (ref 0.0–0.1)
Basophils Relative: 0 %
Eosinophils Absolute: 0.1 10*3/uL (ref 0.0–0.5)
Eosinophils Relative: 1 %
HCT: 34.9 % — ABNORMAL LOW (ref 36.0–46.0)
Hemoglobin: 11.4 g/dL — ABNORMAL LOW (ref 12.0–15.0)
Immature Granulocytes: 0 %
Lymphocytes Relative: 27 %
Lymphs Abs: 2.4 10*3/uL (ref 0.7–4.0)
MCH: 26.5 pg (ref 26.0–34.0)
MCHC: 32.7 g/dL (ref 30.0–36.0)
MCV: 81.2 fL (ref 80.0–100.0)
Monocytes Absolute: 0.5 10*3/uL (ref 0.1–1.0)
Monocytes Relative: 6 %
Neutro Abs: 6 10*3/uL (ref 1.7–7.7)
Neutrophils Relative %: 66 %
Platelets: 323 10*3/uL (ref 150–400)
RBC: 4.3 MIL/uL (ref 3.87–5.11)
RDW: 12.9 % (ref 11.5–15.5)
WBC: 9.1 10*3/uL (ref 4.0–10.5)
nRBC: 0 % (ref 0.0–0.2)

## 2020-05-08 LAB — TYPE AND SCREEN
ABO/RH(D): AB POS
Antibody Screen: NEGATIVE

## 2020-05-08 MED ORDER — METHOTREXATE FOR ECTOPIC PREGNANCY
50.0000 mg/m2 | Freq: Once | INTRAMUSCULAR | Status: AC
  Administered 2020-05-08: 85 mg via INTRAMUSCULAR
  Filled 2020-05-08: qty 1

## 2020-05-08 MED ORDER — FENTANYL CITRATE (PF) 100 MCG/2ML IJ SOLN: 25.0000 ug | Freq: Once | INTRAMUSCULAR | Status: DC

## 2020-05-08 MED ORDER — FENTANYL CITRATE (PF) 100 MCG/2ML IJ SOLN
25.0000 ug | Freq: Once | INTRAMUSCULAR | Status: AC
  Administered 2020-05-08: 25 ug via INTRAMUSCULAR
  Filled 2020-05-08: qty 2

## 2020-05-08 NOTE — Discharge Instructions (Signed)
Ectopic Pregnancy ° °An ectopic pregnancy is when the fertilized egg attaches (implants) outside the uterus. Most ectopic pregnancies occur in one of the tubes where eggs travel from the ovary to the uterus (fallopian tubes), but the implanting can occur in other locations. In rare cases, ectopic pregnancies occur on the ovary, intestine, pelvis, abdomen, or cervix. In an ectopic pregnancy, the fertilized egg does not have the ability to develop into a normal, healthy baby. °A ruptured ectopic pregnancy is one in which tearing or bursting of a fallopian tube causes internal bleeding. Often, there is intense lower abdominal pain, and vaginal bleeding sometimes occurs. Having an ectopic pregnancy can be life-threatening. If this dangerous condition is not treated, it can lead to blood loss, shock, or even death. °What are the causes? °The most common cause of this condition is damage to one of the fallopian tubes. A fallopian tube may be narrowed or blocked, and that keeps the fertilized egg from reaching the uterus. °What increases the risk? °This condition is more likely to develop in women of childbearing age who have different levels of risk. The levels of risk can be divided into three categories. °High risk °· You have gone through infertility treatment. °· You have had an ectopic pregnancy before. °· You have had surgery on the fallopian tubes, or another surgical procedure, such as an abortion. °· You have had surgery to have the fallopian tubes tied (tubal ligation). °· You have problems or diseases of the fallopian tubes. °· You have been exposed to diethylstilbestrol (DES). This medicine was used until 1971, and it had effects on babies whose mothers took the medicine. °· You become pregnant while using an IUD (intrauterine device) for birth control. °Moderate risk °· You have a history of infertility. °· You have had an STI (sexually transmitted infection). °· You have a history of pelvic inflammatory  disease (PID). °· You have scarring from endometriosis. °· You have multiple sexual partners. °· You smoke. °Low risk °· You have had pelvic surgery. °· You use vaginal douches. °· You became sexually active before age 18. °What are the signs or symptoms? °Common symptoms of this condition include normal pregnancy symptoms, such as missing a period, nausea, tiredness, abdominal pain, breast tenderness, and bleeding. However, ectopic pregnancy will have additional symptoms, such as: °· Pain with intercourse. °· Irregular vaginal bleeding or spotting. °· Cramping or pain on one side or in the lower abdomen. °· Fast heartbeat, low blood pressure, and sweating. °· Passing out while having a bowel movement. °Symptoms of a ruptured ectopic pregnancy and internal bleeding may include: °· Sudden, severe pain in the abdomen and pelvis. °· Dizziness, weakness, light-headedness, or fainting. °· Pain in the shoulder or neck area. °How is this diagnosed? °This condition is diagnosed by: °· A pelvic exam to locate pain or a mass in the abdomen. °· A pregnancy test. This blood test checks for the presence as well as the specific level of pregnancy hormone in the bloodstream. °· Ultrasound. This is performed if a pregnancy test is positive. In this test, a probe is inserted into the vagina. The probe will detect a fetus, possibly in a location other than the uterus. °· Taking a sample of uterus tissue (dilation and curettage, or D&C). °· Surgery to perform a visual exam of the inside of the abdomen using a thin, lighted tube that has a tiny camera on the end (laparoscope). °· Culdocentesis. This procedure involves inserting a needle at the top of   the vagina, behind the uterus. If blood is present in this area, it may indicate that a fallopian tube is torn. °How is this treated? °This condition is treated with medicine or surgery. °Medicine °· An injection of a medicine (methotrexate) may be given to cause the pregnancy tissue to be  absorbed. This medicine may save your fallopian tube. It may be given if: °? The diagnosis is made early, with no signs of active bleeding. °? The fallopian tube has not ruptured. °? You are considered to be a good candidate for the medicine. °Usually, pregnancy hormone blood levels are checked after methotrexate treatment. This is to be sure that the medicine is effective. It may take 4-6 weeks for the pregnancy to be absorbed. Most pregnancies will be absorbed by 3 weeks. °Surgery °· A laparoscope may be used to remove the pregnancy tissue. °· If severe internal bleeding occurs, a larger cut (incision) may be made in the lower abdomen (laparotomy) to remove the fetus and placenta. This is done to stop the bleeding. °· Part or all of the fallopian tube may be removed (salpingectomy) along with the fetus and placenta. The fallopian tube may also be repaired during the surgery. °· In very rare circumstances, removal of the uterus (hysterectomy) may be required. °· After surgery, pregnancy hormone testing may be done to be sure that there is no pregnancy tissue left. °Whether your treatment is medicine or surgery, you may receive a Rho (D) immune globulin shot to prevent problems with any future pregnancy. This shot may be given if: °· You are Rh-negative and the baby's father is Rh-positive. °· You are Rh-negative and you do not know the Rh type of the baby's father. °Follow these instructions at home: °· Rest and limit your activity after the procedure for as long as told by your health care provider. °· Until your health care provider says that it is safe: °? Do not lift anything that is heavier than 10 lb (4.5 kg), or the limit that your health care provider tells you. °? Avoid physical exercise and any movement that requires effort (is strenuous). °· To help prevent constipation: °? Eat a healthy diet that includes fruits, vegetables, and whole grains. °? Drink 6-8 glasses of water per day. °Get help right away  if: °· You develop worsening pain that is not relieved by medicine. °· You have: °? A fever or chills. °? Vaginal bleeding. °? Redness and swelling at the incision site. °? Nausea and vomiting. °· You feel dizzy or weak. °· You feel light-headed or you faint. °This information is not intended to replace advice given to you by your health care provider. Make sure you discuss any questions you have with your health care provider. °Document Revised: 08/12/2017 Document Reviewed: 03/31/2016 °Elsevier Patient Education © 2020 [employer] ° ° °Methotrexate Treatment for an Ectopic Pregnancy ° °Methotrexate is a medicine that treats an ectopic pregnancy. An ectopic pregnancy is a pregnancy in which the fetus develops outside the uterus. This kind of pregnancy can be dangerous. Methotrexate works by stopping the growth of the fertilized egg. It also helps your body absorb tissue from the egg. This takes between 2-6 weeks. Most ectopic pregnancies can be successfully treated with methotrexate if they are diagnosed early. °Tell a health care provider about: °· Any allergies you have. °· All medicines you are taking, including vitamins, herbs, eye drops, creams, and over-the-counter medicines. °· Any medical conditions you have. °What are the risks? °Generally, this is   a safe treatment. However, problems may occur, including: °· Nausea or vomiting or both. °· Vaginal bleeding or spotting. °· Diarrhea. °· Abdominal cramping. °· Dizziness or feeling lightheaded. °· Mouth sores. °· Swelling or irritation of the lining of your lungs (pneumonitis). °· Liver damage. °· Hair loss. °There is a risk that methotrexate treatment will fail and your pregnancy will continue. There is also a risk that the ectopic pregnancy might rupture while you are using this medicine. °What happens before the procedure? °· Liver tests, kidney tests, and a complete blood test will be done. °· Blood tests will be done to measure the pregnancy hormone  levels and to determine your blood type. °· If you are Rh-negative and the father is Rh-positive or his Rh type is not known, you will be given a Rho (D) immune globulin shot. °What happens during the procedure? °Your health care provider may give you methotrexate by injection or in the form of a pill. Methotrexate may be given as a single dose of medicine or a series of doses, depending on your response to the treatment. °· Methotrexate injections will be given by your health care provider. This is the most common way that methotrexate is used to treat an ectopic pregnancy. °· If you are prescribed oral methotrexate, it is very important that you follow your health care provider's instructions on how to take oral methotrexate. °Additional medicines may be needed to manage an ectopic pregnancy. °The procedure may vary among health care providers and hospitals. °What happens after the procedure? °· You may have abdominal cramping, vaginal bleeding, and fatigue. °· Blood tests will be taken at timed intervals for several days or weeks to check your pregnancy hormone levels. The blood tests will be done until the pregnancy hormone can no longer be detected in the blood. °· You may need to have a surgical procedure to remove the ectopic pregnancy if methotrexate treatment fails. °· Follow instructions from your health care provider on how and when to report any symptoms that may indicate a ruptured ectopic pregnancy. °Summary °· Methotrexate is a medicine that treats an ectopic pregnancy. °· Methotrexate may be given in a single dose or a series of doses over time. °· Blood tests will be taken at timed intervals for several days or weeks to check your pregnancy hormone levels. The blood tests will be done until no more pregnancy hormone is detected in the blood. °· There is a risk that methotrexate treatment will fail and your pregnancy will continue. There is also a risk that the ectopic pregnancy might rupture while  you are using this medicine. °This information is not intended to replace advice given to you by your health care provider. Make sure you discuss any questions you have with your health care provider. °Document Revised: 08/12/2017 Document Reviewed: 10/19/2016 °Elsevier Patient Education © 2020 Elsevier Inc. ° °

## 2020-05-10 ENCOUNTER — Inpatient Hospital Stay (HOSPITAL_COMMUNITY)
Admission: AD | Admit: 2020-05-10 | Discharge: 2020-05-10 | Disposition: A | Discharge status: Home / Self Care | Payer: BC Managed Care – PPO | Attending: Obstetrics & Gynecology | Admitting: Obstetrics & Gynecology

## 2020-05-10 ENCOUNTER — Other Ambulatory Visit: Payer: Self-pay

## 2020-05-10 DIAGNOSIS — Z79899 Other long term (current) drug therapy: Secondary | ICD-10-CM | POA: Insufficient documentation

## 2020-05-10 DIAGNOSIS — O00102 Left tubal pregnancy without intrauterine pregnancy: Secondary | ICD-10-CM | POA: Diagnosis not present

## 2020-05-10 DIAGNOSIS — Z3A01 Less than 8 weeks gestation of pregnancy: Secondary | ICD-10-CM | POA: Diagnosis not present

## 2020-05-10 DIAGNOSIS — Z79890 Hormone replacement therapy (postmenopausal): Secondary | ICD-10-CM | POA: Diagnosis not present

## 2020-05-10 DIAGNOSIS — O00202 Left ovarian pregnancy without intrauterine pregnancy: Secondary | ICD-10-CM

## 2020-05-10 LAB — HCG, QUANTITATIVE, PREGNANCY: hCG, Beta Chain, Quant, S: 31 m[IU]/mL — ABNORMAL HIGH (ref <5)

## 2020-05-10 NOTE — MAU Note (Signed)
[name] is a 34 y.o. at [redacted]w[redacted]d here in MAU reporting: here for day 4 labs post mtx. States she has had some pain but none since Thursday and pain was resolved with tylenol. Sometimes has pain when having a bowel movement. Having light bleeding.  Pain score: 0/10  Vitals:   05/10/20 1528  BP: 109/65  Pulse: 91  Resp: 16  Temp: 98.1 F (36.7 C)  SpO2: 100%     Lab orders placed from triage: hcg

## 2020-05-10 NOTE — MAU Provider Note (Signed)
Chief Complaint: Follow-up  SUBJECTIVE HPI: [name] is a 34 y.o. G2P1001 s/p recent methotrexate treatment for left ectopic pregnancy on 05/07/20 at [redacted]w[redacted]d. Pt initially had a positive pregnancy test on 8/21 and then presented to the MAU on 8/25 given concern of left lower quadrant pain in addition to vaginal bleeding. [place] in MAU on 8/25 showed a probable ectopic pregnancy on the left. Dr. Sallye Ober (on call for Dr. Dion Body) was called and after shared decision making, patient elected for methotrexate rather than surgery.  Today, pt presents for repeat beta-hcg level. She denies abdominal pain but does report decreased appetite secondary to nausea. She had several days of light spotting s/p administration of methotrexate but none today. Pt has already called her prenatal clinic to schedule her day 7 beta-hcg level.  HPI  Past Medical History:  Diagnosis Date  . Medical history non-contributory    Past Surgical History:  Procedure Laterality Date  . CESAREAN SECTION N/A 03/20/2018   Procedure: CESAREAN SECTION;  Surgeon: Geryl Rankins, MD;  Location: [facility] BIRTHING SUITES;  Service: Obstetrics;  Laterality: N/A;  . NO PAST SURGERIES     Social History   Socioeconomic History  . Marital status: Married    Spouse name: Not on file  . Number of children: Not on file  . Years of education: Not on file  . Highest education level: Not on file  Occupational History  . Not on file  Tobacco Use  . Smoking status: Never Smoker  . Smokeless tobacco: Never Used  Substance and Sexual Activity  . Alcohol use: No  . Drug use: No  . Sexual activity: Yes  Other Topics Concern  . Not on file  Social History Narrative  . Not on file   Social Determinants of Health   Financial Resource Strain:   . Difficulty of Paying Living Expenses: Not on file  Food Insecurity:   . Worried About Programme researcher, broadcasting/film/video in the Last Year: Not on file  . Ran Out of Food in the Last Year: Not on file   Transportation Needs:   . Lack of Transportation (Medical): Not on file  . Lack of Transportation (Non-Medical): Not on file  Physical Activity:   . Days of Exercise per Week: Not on file  . Minutes of Exercise per Session: Not on file  Stress:   . Feeling of Stress : Not on file  Social Connections:   . Frequency of Communication with Friends and Family: Not on file  . Frequency of Social Gatherings with Friends and Family: Not on file  . Attends Religious Services: Not on file  . Active Member of Clubs or Organizations: Not on file  . Attends Banker Meetings: Not on file  . Marital Status: Not on file  Intimate Partner Violence:   . Fear of Current or Ex-Partner: Not on file  . Emotionally Abused: Not on file  . Physically Abused: Not on file  . Sexually Abused: Not on file   No current facility-administered medications on file prior to encounter.   Current Outpatient Medications on File Prior to Encounter  Medication Sig Dispense Refill  . docusate sodium (COLACE) 100 MG capsule Take 1 capsule (100 mg total) by mouth 2 (two) times daily. 30 capsule 0  . ferrous sulfate 325 (65 FE) MG tablet Take 1 tablet (325 mg total) by mouth daily with breakfast. 30 tablet 3  . levothyroxine (SYNTHROID, LEVOTHROID) 75 MCG tablet Take 75 mcg by mouth daily  before breakfast.    . oxyCODONE-acetaminophen (PERCOCET) 5-325 MG tablet Take 1 tablet by mouth every 6 (six) hours as needed for severe pain. 15 tablet 0   No Known Allergies  ROS:  Review of Systems  Constitutional: Positive for appetite change. Negative for chills and fever.  Gastrointestinal: Positive for nausea. Negative for abdominal pain and vomiting.  Genitourinary: Negative for vaginal bleeding.  Musculoskeletal: Negative for back pain.   I have reviewed patient's Past Medical Hx, Surgical Hx, Family Hx, Social Hx, medications and allergies.   Physical Exam   Patient Vitals for the past 24 hrs:  BP Temp  Temp src Pulse Resp SpO2  05/10/20 1528 109/65 98.1 F (36.7 C) Oral 91 16 100 %   Constitutional: Well-developed, well-nourished female in no acute distress. Sitting comfortably in chair. Cardiovascular: normal rate Respiratory: normal effort GI: Abd soft, non-tender, non-distended. Pos BS x 4 MS: Extremities nontender, no edema, normal ROM Neurologic: Alert and oriented x 4.  GU: deferred  LAB RESULTS Results for orders placed or performed during the hospital encounter of 05/10/20 (from the past 24 hour(s))  hCG, quantitative, pregnancy     Status: Abnormal   Collection Time: 05/10/20  3:01 PM  Result Value Ref Range   hCG, Beta Chain, Quant, S 31 (H) <5 mIU/mL    --/--/AB POS (08/26 0012)  IMAGING US OB LESS THAN 14 WEEKS W/ OB TRANSVAGINAL AND DOPPLER  Result Date: 05/07/2020 CLINICAL DATA:  34 year old pregnant female with pelvic pain. EXAM: OBSTETRIC <14 WK [place] AND TRANSVAGINAL OB US DOPPLER ULTRASOUND OF OVARIES TECHNIQUE: Both transabdominal and transvaginal ultrasound examinations were performed for complete evaluation of the gestation as well as the maternal uterus, adnexal regions, and pelvic cul-de-sac. Transvaginal technique was performed to assess early pregnancy. Color and duplex Doppler ultrasound was utilized to evaluate blood flow to the ovaries. COMPARISON:  None. FINDINGS: The uterus is retroverted and appears unremarkable. The endometrium measures 4 mm in thickness and appears unremarkable. No intrauterine pregnancy identified. The right ovary is unremarkable and measures 2.5 x 2.3 x 2.4 cm. The left ovary measures 3.5 x 3.5 x 2.7 cm. There is a 3.3 x 1.9 x 2.9 cm echogenic mass inferior to the left ovary which demonstrates internal vascularity. This mass appears to be separate from the left ovary and may represent an ectopic pregnancy, possibly tubal. Clinical correlation and obstetrical consult is advised. Trace free fluid in the pelvis. Pulsed Doppler evaluation of  both ovaries demonstrates normal appearing low-resistance arterial and venous waveforms. IMPRESSION: No intrauterine pregnancy identified. An echogenic mass inferior to the left ovary is concerning for an ectopic pregnancy. Clinical correlation and obstetrical consult is advised. These results were called by telephone at the time of interpretation on 05/07/2020 at 10:40 pm to nurse Juleen China who verbally acknowledged these results. Electronically Signed   By: Elgie Collard M.D.   On: 05/07/2020 22:50    MAU Management/MDM: Orders Placed This Encounter  Procedures  . hCG, quantitative, pregnancy  . Discharge patient    No orders of the defined types were placed in this encounter.   Treatments in MAU included: none. Pt discharged with strict return precautions for recurrent abdominal pain, worsening nausea/vomiting, inability to tolerate po intake, and recurrent vaginal bleeding.  ASSESSMENT 1. Left tubal pregnancy, unspecified whether intrauterine pregnancy present: [name] is a 34 y.o. G2P1001 s/p recent methotrexate treatment on 05/07/20 for left ectopic pregnancy at [redacted]w[redacted]d confirmed on [place]. Pt initially had a positive pregnancy test on  8/21 and then presented to the MAU given concern of left lower quadrant pain in addition to vaginal bleeding. Dr. Sallye Ober (on call for Dr. Dion Body) was called on 8/25 and after shared decision making, patient elected for methotrexate rather than surgery. Today beta-hcg level 31 down from 52.1 on 8/25. Given reassuring exam today without concerning symptoms, will plan for day 7 beta-hcg level in clinic. -plan for follow-up day 7 beta-hcg level in prenatal clinic (pt has already called to schedule) -provided reassurance and strict return precautions as noted above    PLAN Discharge home Allergies as of 05/10/2020   No Known Allergies     Medication List    TAKE these medications   docusate sodium 100 MG capsule Commonly known as: Colace Take 1 capsule (100  mg total) by mouth 2 (two) times daily.   ferrous sulfate 325 (65 FE) MG tablet Take 1 tablet (325 mg total) by mouth daily with breakfast.   levothyroxine 75 MCG tablet Commonly known as: SYNTHROID Take 75 mcg by mouth daily before breakfast.   oxyCODONE-acetaminophen 5-325 MG tablet Commonly known as: Percocet Take 1 tablet by mouth every 6 (six) hours as needed for severe pain.       Lynnda Shields, MD OB Fellow, Faculty Practice 05/10/2020  6:06 PM

## 2020-05-13 DIAGNOSIS — O009 Unspecified ectopic pregnancy without intrauterine pregnancy: Secondary | ICD-10-CM | POA: Diagnosis not present

## 2020-05-20 DIAGNOSIS — O00109 Unspecified tubal pregnancy without intrauterine pregnancy: Secondary | ICD-10-CM | POA: Diagnosis not present

## 2020-05-27 DIAGNOSIS — O00109 Unspecified tubal pregnancy without intrauterine pregnancy: Secondary | ICD-10-CM | POA: Diagnosis not present

## 2020-09-15 DIAGNOSIS — Z20822 Contact with and (suspected) exposure to covid-19: Secondary | ICD-10-CM | POA: Diagnosis not present

## 2021-02-10 ENCOUNTER — Ambulatory Visit: Payer: BC Managed Care – PPO | Admitting: Medical-Surgical

## 2021-02-26 DIAGNOSIS — Z8639 Personal history of other endocrine, nutritional and metabolic disease: Secondary | ICD-10-CM | POA: Diagnosis not present

## 2021-02-26 DIAGNOSIS — Z833 Family history of diabetes mellitus: Secondary | ICD-10-CM | POA: Diagnosis not present

## 2021-02-26 DIAGNOSIS — Z8249 Family history of ischemic heart disease and other diseases of the circulatory system: Secondary | ICD-10-CM | POA: Diagnosis not present

## 2021-02-26 DIAGNOSIS — Z13228 Encounter for screening for other metabolic disorders: Secondary | ICD-10-CM | POA: Diagnosis not present

## 2021-02-26 DIAGNOSIS — Z1321 Encounter for screening for nutritional disorder: Secondary | ICD-10-CM | POA: Diagnosis not present

## 2021-02-26 DIAGNOSIS — Z131 Encounter for screening for diabetes mellitus: Secondary | ICD-10-CM | POA: Diagnosis not present

## 2021-02-26 DIAGNOSIS — Z13220 Encounter for screening for lipoid disorders: Secondary | ICD-10-CM | POA: Diagnosis not present

## 2021-02-26 DIAGNOSIS — Z79811 Long term (current) use of aromatase inhibitors: Secondary | ICD-10-CM | POA: Diagnosis not present

## 2021-02-26 DIAGNOSIS — Z1329 Encounter for screening for other suspected endocrine disorder: Secondary | ICD-10-CM | POA: Diagnosis not present

## 2021-02-26 DIAGNOSIS — Z0000 Encounter for general adult medical examination without abnormal findings: Secondary | ICD-10-CM | POA: Diagnosis not present

## 2021-02-26 DIAGNOSIS — Z8759 Personal history of other complications of pregnancy, childbirth and the puerperium: Secondary | ICD-10-CM | POA: Diagnosis not present

## 2021-04-02 IMAGING — US US OB < 14 WKS - US OB TV - US DOPPLER
1 series · 13 of 28 positions shown · non-contrast
Comparison: None.

CLINICAL DATA: 33-year-old pregnant female with pelvic pain.



[Series 1: us ob less than 14 weeks w/ ob transvaginal and do · 13 of 161 slices shown]
[im 6/161]
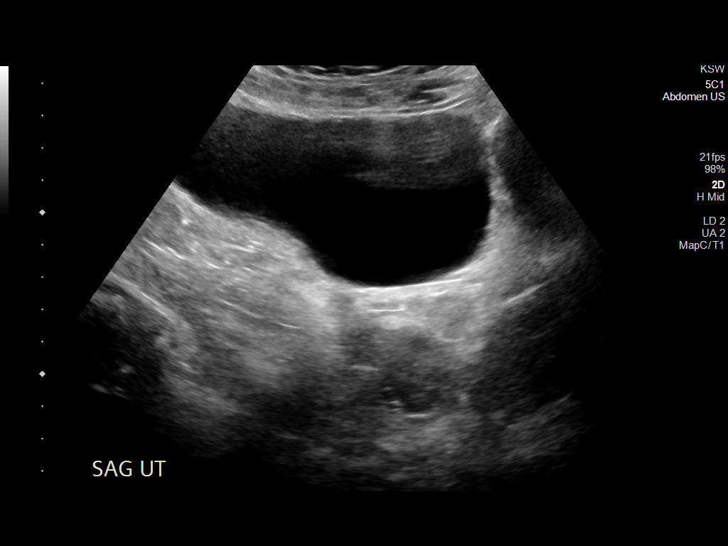
[im 18/161]
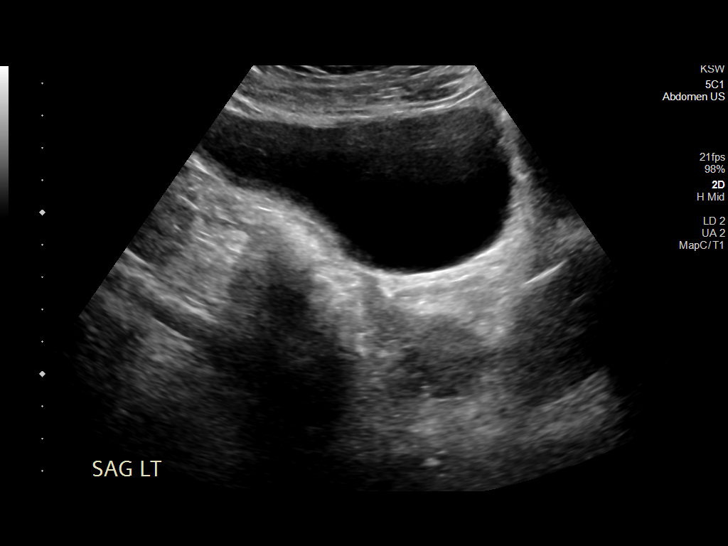
[im 30/161]
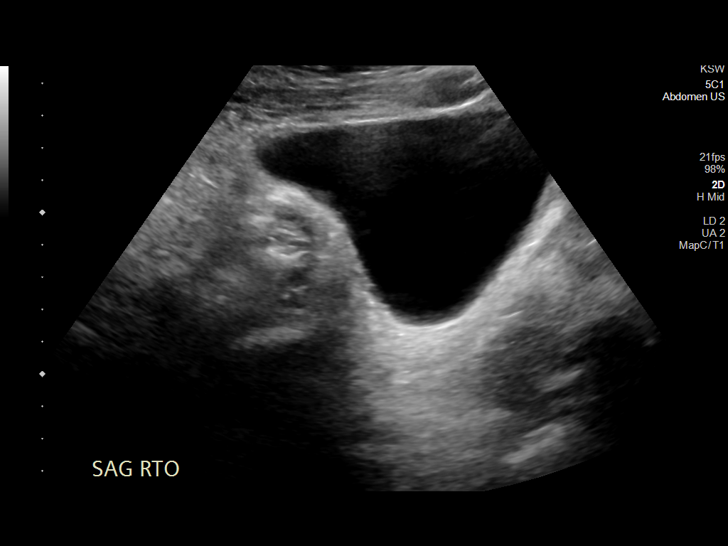
[im 42/161]
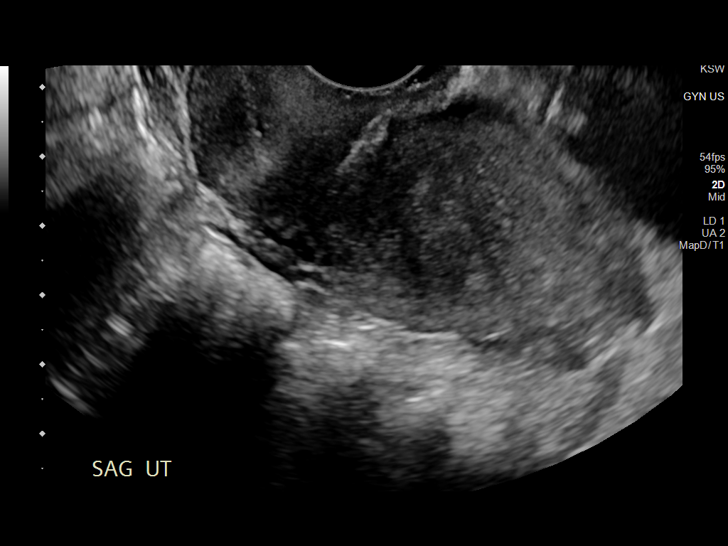
[im 54/161]
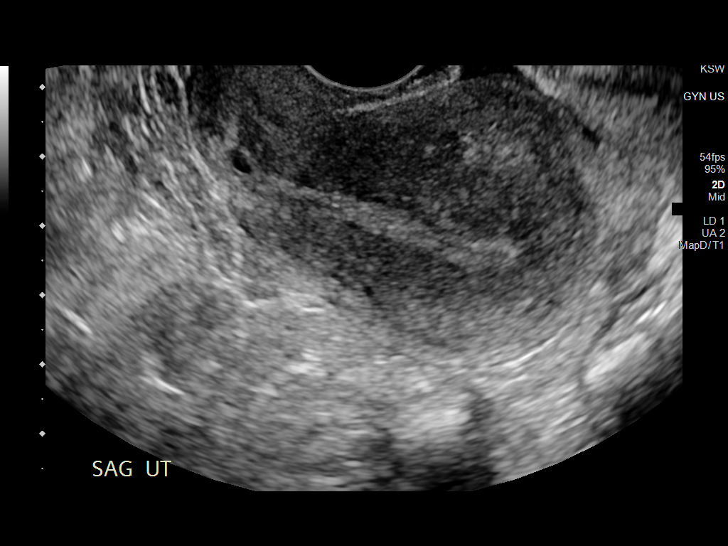
[im 66/161]
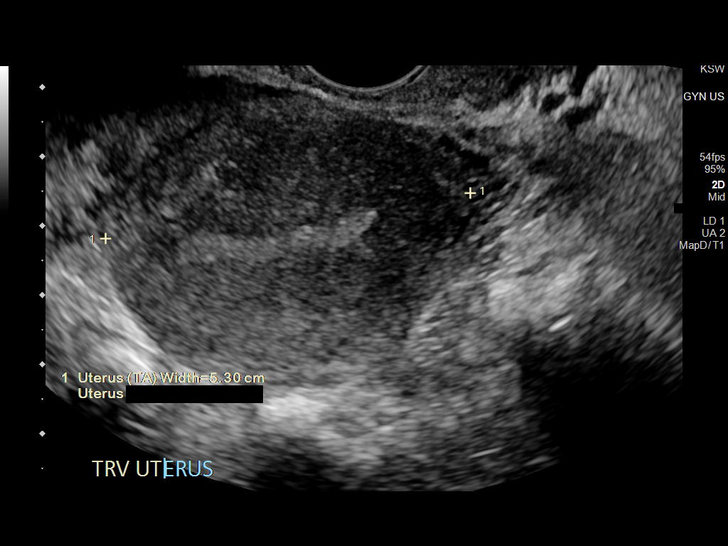
[im 83/161]
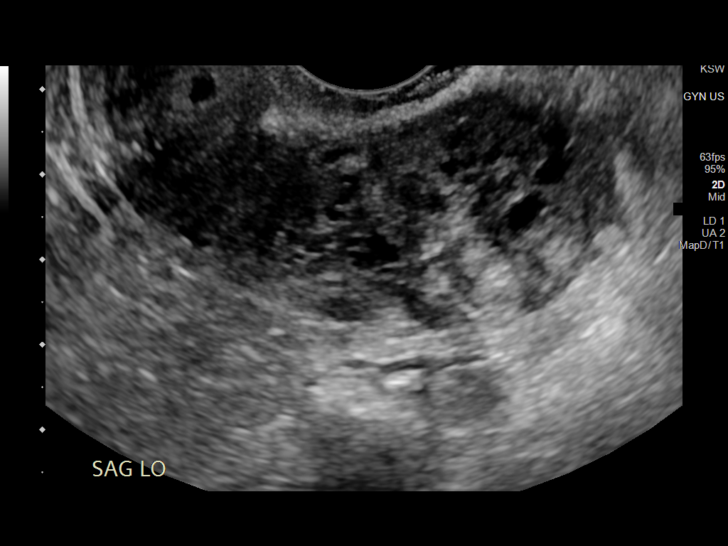
[im 95/161]
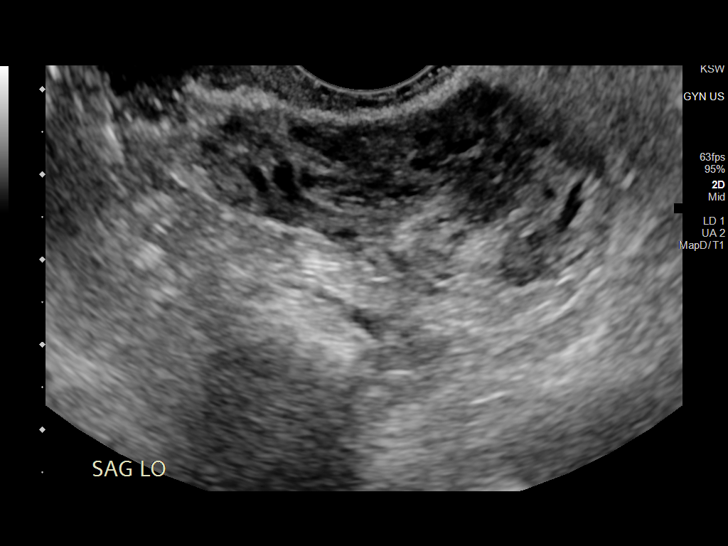
[im 107/161]
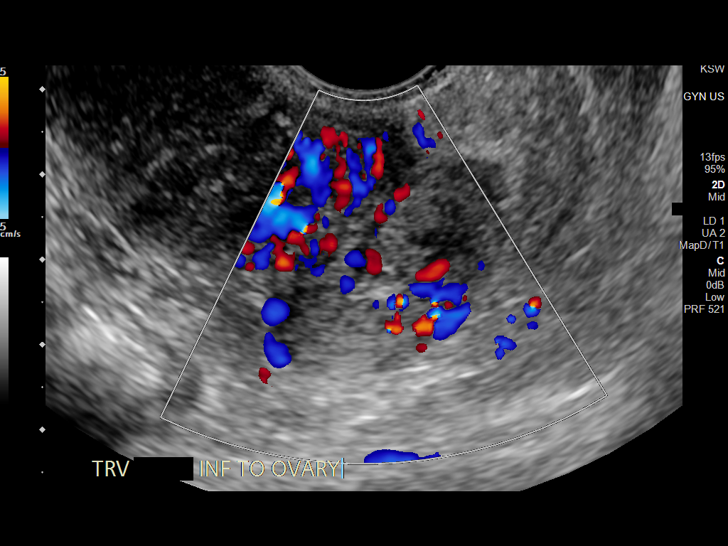
[im 119/161]
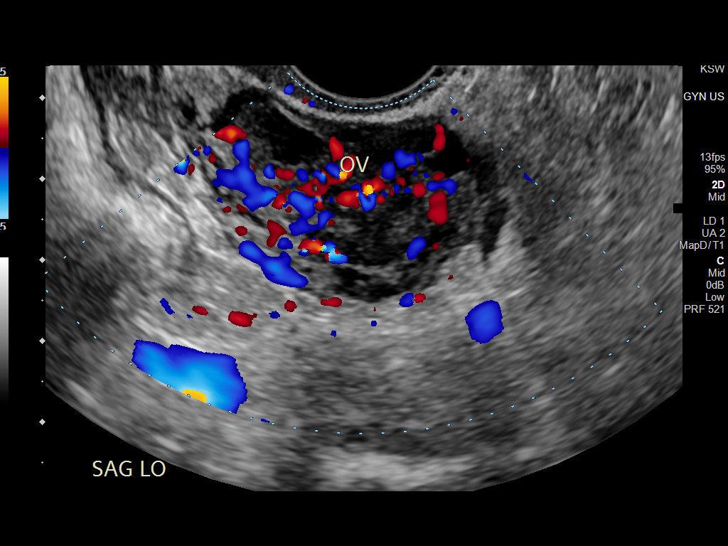
[im 131/161]
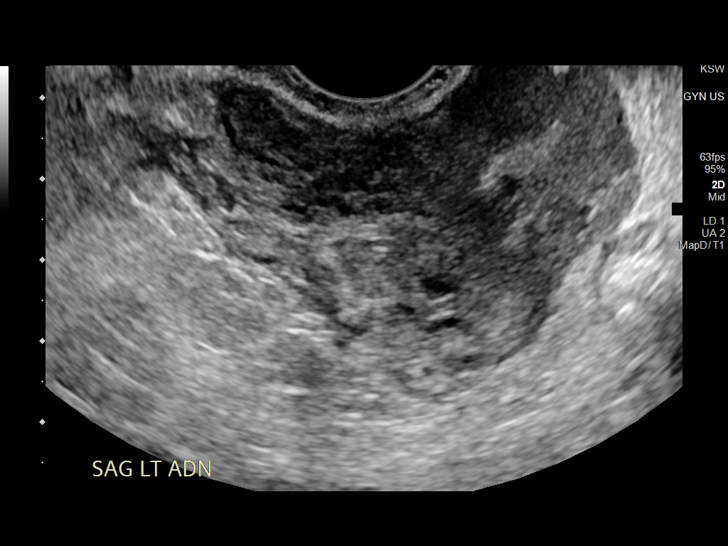
[im 143/161]
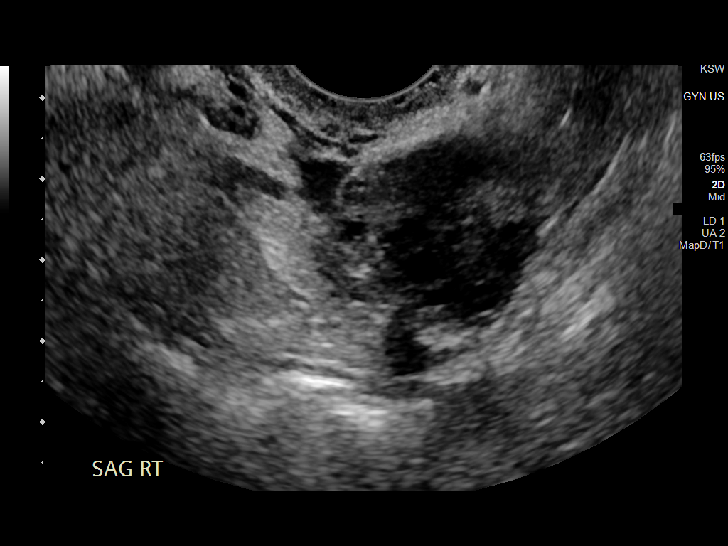
[im 155/161]
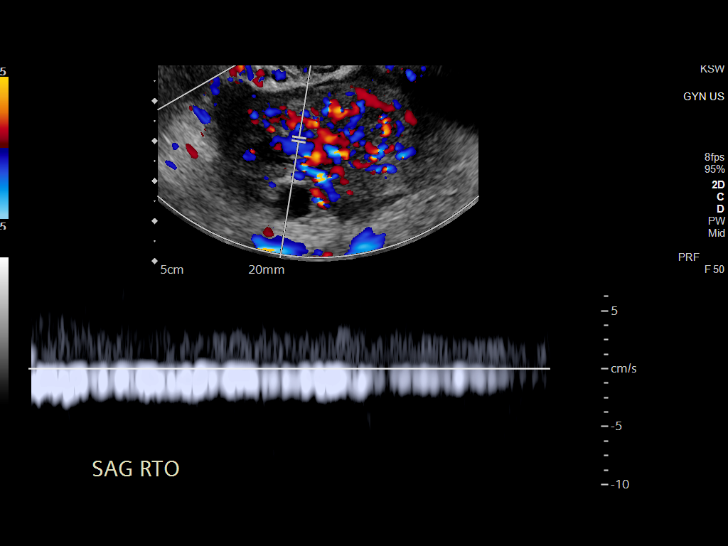

[13 of 28 positions shown; findings below may reference images not displayed]

FINDINGS: The uterus is retroverted and appears unremarkable.

The endometrium measures 4 mm in thickness and appears unremarkable.
No intrauterine pregnancy identified.

The right ovary is unremarkable and measures 2.5 x 2.3 x 2.4 cm.

The left ovary measures 3.5 x 3.5 x 2.7 cm. There is a 3.3 x 1.9 x
2.9 cm echogenic mass inferior to the left ovary which demonstrates
internal vascularity. This mass appears to be separate from the left
ovary and may represent an ectopic pregnancy, possibly tubal.
Clinical correlation and obstetrical consult is advised.

Trace free fluid in the pelvis.

Pulsed Doppler evaluation of both ovaries demonstrates normal
appearing low-resistance arterial and venous waveforms.
IMPRESSION: No intrauterine pregnancy identified. An echogenic mass inferior to
the left ovary is concerning for an ectopic pregnancy. Clinical
correlation and obstetrical consult is advised.

These results were called by telephone at the time of interpretation
on 05/07/2020 at [DATE] to nurse [place] who verbally acknowledged
these results.
